# Patient Record
Sex: Male | Born: 2000 | Race: White | Hispanic: No | Marital: Single | Smoking: Never smoker
Health system: Southern US, Community
[De-identification: ages and names within clinical notes are randomized; demographics above are authoritative.]

## PROBLEM LIST (undated history)

## (undated) DIAGNOSIS — F419 Anxiety disorder, unspecified: Secondary | ICD-10-CM

## (undated) DIAGNOSIS — F909 Attention-deficit hyperactivity disorder, unspecified type: Secondary | ICD-10-CM

## (undated) DIAGNOSIS — F429 Obsessive-compulsive disorder, unspecified: Secondary | ICD-10-CM

## (undated) DIAGNOSIS — G4730 Sleep apnea, unspecified: Secondary | ICD-10-CM

## (undated) HISTORY — PX: TYMPANOSTOMY: SHX2586

## (undated) HISTORY — DX: Anxiety disorder, unspecified: F41.9

## (undated) HISTORY — PX: TONSILLECTOMY AND ADENOIDECTOMY: SHX28

## (undated) HISTORY — DX: Attention-deficit hyperactivity disorder, unspecified type: F90.9

## (undated) HISTORY — DX: Obsessive-compulsive disorder, unspecified: F42.9

## (undated) HISTORY — DX: Sleep apnea, unspecified: G47.30

---
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
## 2021-02-19 ENCOUNTER — Other Ambulatory Visit: Payer: Self-pay

## 2021-02-19 ENCOUNTER — Ambulatory Visit (INDEPENDENT_AMBULATORY_CARE_PROVIDER_SITE_OTHER): Payer: BC Managed Care – PPO

## 2021-02-19 ENCOUNTER — Ambulatory Visit (INDEPENDENT_AMBULATORY_CARE_PROVIDER_SITE_OTHER): Payer: BC Managed Care – PPO | Admitting: Sports Medicine

## 2021-02-19 DIAGNOSIS — M25362 Other instability, left knee: Secondary | ICD-10-CM | POA: Diagnosis not present

## 2021-02-19 DIAGNOSIS — M25561 Pain in right knee: Secondary | ICD-10-CM | POA: Diagnosis not present

## 2021-02-19 DIAGNOSIS — M25562 Pain in left knee: Secondary | ICD-10-CM

## 2021-02-19 MED ORDER — MELOXICAM 15 MG PO TABS: ORAL_TABLET | ORAL | 3 refills | Status: DC

## 2021-02-19 NOTE — Progress Notes (Signed)
    Procedures performed today:    None.  Independent interpretation of notes and tests performed by another provider:   None.  Brief History, Exam, Impression, and Recommendations:    Patellar instability of left knee This is a pleasant 20 year old male, has a long history of left anterior knee pain, as well as a sensation of instability in his patella. Worsening pain up and down stairs, getting up from the floor. On exam he has patellar subluxation both medially and laterally with reproduction of pain, he does have some tenderness at the patellar facets. Other ligamentous structures are intact, we discussed the pathophysiology, anatomy, we will do meloxicam, x-rays, formal physical therapy, he will also get a patellar stabilizing orthosis today, return to see me in 6 weeks, MRI if no better.    ___________________________________________ Ihor Austin. Benjamin Stain, M.D., ABFM., CAQSM. Primary Care and Sports Medicine [facility]  Adjunct Instructor of Family Medicine  [employer]

## 2021-02-19 NOTE — Assessment & Plan Note (Signed)
This is a pleasant 20 year old male, has a long history of left anterior knee pain, as well as a sensation of instability in his patella. Worsening pain up and down stairs, getting up from the floor. On exam he has patellar subluxation both medially and laterally with reproduction of pain, he does have some tenderness at the patellar facets. Other ligamentous structures are intact, we discussed the pathophysiology, anatomy, we will do meloxicam, x-rays, formal physical therapy, he will also get a patellar stabilizing orthosis today, return to see me in 6 weeks, MRI if no better.

## 2021-02-22 ENCOUNTER — Ambulatory Visit (INDEPENDENT_AMBULATORY_CARE_PROVIDER_SITE_OTHER): Payer: BC Managed Care – PPO | Admitting: Rehabilitative and Restorative Service Providers"

## 2021-02-22 ENCOUNTER — Other Ambulatory Visit: Payer: Self-pay

## 2021-02-22 DIAGNOSIS — M25562 Pain in left knee: Secondary | ICD-10-CM

## 2021-02-22 DIAGNOSIS — M25561 Pain in right knee: Secondary | ICD-10-CM

## 2021-02-22 DIAGNOSIS — M6281 Muscle weakness (generalized): Secondary | ICD-10-CM

## 2021-02-22 NOTE — Addendum Note (Signed)
Addended by: Margretta Ditty M on: 02/22/2021 01:53 PM   Modules accepted: Orders

## 2021-02-22 NOTE — Therapy (Signed)
[facility] [place] [address] [place], [place], [address] Phone: [phone]   Fax:  [phone]  Physical Therapy Evaluation  Patient Details  Name: Mark Navarro MRN: [id number] Date of Birth: 2001/01/17 Referring Provider (PT): Rodney Langton, MD   Encounter Date: 02/22/2021   PT End of Session - 02/22/21 0858    Visit Number 1    Number of Visits 12    Date for PT Re-Evaluation 04/05/21    Authorization Type BCBS    PT Start Time 0808    PT Stop Time 0855    PT Time Calculation (min) 47 min    Activity Tolerance Patient tolerated treatment well;Patient limited by pain    Behavior During Therapy [facility] for tasks assessed/performed           Past Medical History:  Diagnosis Date  . ADHD (attention deficit hyperactivity disorder)   . Anxiety   . OCD (obsessive compulsive disorder)     Past Surgical History:  Procedure Laterality Date  . TONSILLECTOMY AND ADENOIDECTOMY    . TYMPANOSTOMY      There were no vitals filed for this visit.    Subjective Assessment - 02/22/21 0812    Subjective The patient reports his left knee began hurting 02/07/21.  He has a h/o of popping his knee frequently.  He notes pain in the medial aspect of his knee-- the brace and meloxicam help.  Pain is worse with standing at work, stairs, and squating.    Patient Stated Goals pain free; increased strength in knees-- ability to lift, hike, without pain    Currently in Pain? Yes    Pain Score 0-No pain   none at rest; up to 8/10 at it's worst   Pain Location Knee    Pain Orientation Left    Pain Descriptors / Indicators Aching;Discomfort;Sore    Pain Type Acute pain    Pain Onset 1 to 4 weeks ago    Pain Frequency Intermittent    Aggravating Factors  worse at night; occasional sharp pains    Pain Relieving Factors rest, avoiding weight bearing              [facility] PT Assessment - 02/22/21 0819      Assessment   Medical  Diagnosis patellar instability of the L knee    Referring Provider (PT) Rodney Langton, MD    Onset Date/Surgical Date 02/07/21    Prior Therapy none      Precautions   Precautions Other (comment)    Precaution Comments brace on L knee for 6 weeks    Required Braces or Orthoses --   knee stsbility brace     Restrictions   Weight Bearing Restrictions No      Balance Screen   Has the patient fallen in the past 6 months No    Has the patient had a decrease in activity level because of a fear of falling?  No    Is the patient reluctant to leave their home because of a fear of falling?  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[employer]; stands x 6 hours    Leisure hikes some; [employer]      Observation/Other Assessments   Focus on Therapeutic Outcomes (FOTO)  40% functional status score      Sensation   Light Touch Appears Intact      ROM / Strength   AROM / PROM / Strength AROM;Strength      AROM   Overall AROM  Deficits    AROM Assessment Site Knee    Right/Left Knee Right;Left    Right Knee Extension 0    Right Knee Flexion 120    Left Knee Extension -4    Left Knee Flexion 116   pressure in medial patellar region     Strength   Overall Strength Deficits    Strength Assessment Site Hip;Knee;Ankle    Right/Left Hip Right;Left    Right Hip Flexion 5/5    Right Hip Extension 4+/5    Right Hip ABduction 4/5    Left Hip Flexion 4/5    Left Hip Extension 4/5    Left Hip ABduction 4-/5    Right/Left Knee Right;Left    Right Knee Flexion 5/5    Right Knee Extension 5/5    Left Knee Flexion 4/5    Left Knee  Extension 4/5    Right/Left Ankle Right;Left    Right Ankle Dorsiflexion 5/5    Right Ankle Plantar Flexion 4/5    Left Ankle Dorsiflexion 5/5    Left Ankle Plantar Flexion 5/5    Left Ankle Inversion 4/5      Flexibility   Soft Tissue Assessment /Muscle Length yes    Hamstrings -38 L and -30 R (beginning at 90/90 supine)      Palpation   Patella mobility hypermobile medial patellar mobility      Special Tests    Special Tests Knee Special Tests    Other special tests drawer tests negative; wall slides= R knee pain today with pain shifted to the right side to offset the L side    Knee Special tests  Step-up/Step Down Test      Patellofemoral Apprehension Test    Findings --      Step-up/Step Down    Findings Positive                      Objective measurements completed on examination: See above findings.       [facility] Adult PT Treatment/Exercise - 02/22/21 1350      Exercises   Exercises Knee/Hip      Knee/Hip Exercises: Stretches   Active Hamstring Stretch Right;Left;1 rep;30 seconds      Knee/Hip Exercises: Standing   Heel Raises Both;20 reps    SLS x 15 seconds with intermittent UE support      Knee/Hip Exercises: Supine   Bridges Strengthening;Both;10 reps    Straight Leg Raises Strengthening;Right;Left;10 reps      Knee/Hip Exercises: Sidelying   Hip ABduction Strengthening;Right;Left;10 reps                  PT Education - 02/22/21 0851    Education Details HEP    Person(s) Educated Patient    Methods Explanation;Demonstration;Handout    Comprehension Verbalized understanding;Returned demonstration               PT Long Term Goals - 02/22/21 0858      PT LONG TERM GOAL #1   Title The  patient will be indep with HEP.    Time 6    Period Weeks    Target Date 04/05/21      PT LONG TERM GOAL #2   Title The patient will improve functional status score from 40% up to 75%.    Time 6    Period Weeks    Target Date 04/05/21       PT LONG TERM GOAL #3   Title The patient will tolerate left single limb stance x 15 seconds without UE support.    Baseline tolerates 3 seconds.    Time 6    Period Weeks    Target Date 04/05/21      PT LONG TERM GOAL #4   Title The patient will demonstrate squat to reach the floor and return to standing.    Baseline *unable to tolerate wall slides with bilateral knee pain    Time 6    Period Weeks    Target Date 04/05/21      PT LONG TERM GOAL #5   Title The patient will improve HS length to -20 degrees from extension (supine 90/90 starting position).    Time 6    Period Weeks    Target Date 04/05/21                  Plan - 02/22/21 0901    Clinical Impression Statement The patient is an 20 year old male presenting to OP physical therapy with acute L knee pain.  He presents with impairments in LE strength, L knee AROM, patellar hypermobility, and pain limiting activities.  He has difficulty with functional tasks of step ups, squatting, or maintaining single leg stance.  PT to address deficits to optimize functional status.    Examination-Activity Limitations Locomotion Level;Squat;Stairs;Stand;Lift;Sit    Examination-Participation Restrictions Occupation;Community Activity    Stability/Clinical Decision Making Stable/Uncomplicated    Clinical Decision Making Low    Rehab Potential Good    PT Frequency 2x / week    PT Duration 6 weeks    PT Treatment/Interventions Patient/family education;Taping;Dry needling;Manual techniques;Therapeutic exercise;Therapeutic activities;Orthotic Fit/Training;ADLs/Self Care Home Management;Gait training;Stair training;Functional mobility training;Cryotherapy;Moist Heat;Iontophoresis 4mg /ml Dexamethasone    PT Next Visit Plan Knee stabilization activities; work on core/hip/knee/ankle stability; progress home walking to tolerance;    PT Home Exercise Plan [email or web address]    Consulted and Agree with Plan of Care Patient           Patient  will benefit from skilled therapeutic intervention in order to improve the following deficits and impairments:  Pain,Hypermobility,Postural dysfunction,Decreased range of motion,Decreased strength,Decreased activity tolerance,Increased fascial restricitons,Impaired flexibility  Visit Diagnosis: Acute pain of left knee  Acute pain of right knee  Muscle weakness (generalized)     Problem List Patient Active Problem List   Diagnosis Date Noted  . Patellar instability of left knee 02/19/2021  . Closed torus fracture of lower end of left radius 09/02/2017  . OCD (obsessive compulsive disorder) 05/25/2014  . GAD (generalized anxiety disorder) 05/25/2014    Pasqualina Colasurdo, PT 02/22/2021, 1:52 PM  [facility] [address] [place], [place], [address] Phone: [phone]   Fax:  [phone]  Name: Kaiser Gundlach MRN: [id number] Date of Birth: 2001/03/13

## 2021-02-22 NOTE — Patient Instructions (Signed)
Access Code: [email or web address] URL: [email or web address] Date: 02/22/2021 Prepared by: Margretta Ditty  Exercises Supine Hamstring Stretch with Strap - 2 x daily - 7 x weekly - 1 sets - 3 reps - 30 seconds hold Straight Leg Raise - 2 x daily - 7 x weekly - 1 sets - 10-15 reps Supine Bridge - 2 x daily - 7 x weekly - 1 sets - 10-15 reps Sidelying Hip Abduction - 2 x daily - 7 x weekly - 1 sets - 10-15 reps Single Leg Stance - 2 x daily - 7 x weekly - 1 sets - 3 reps - 15 seconds hold Standing Heel Raise - 2 x daily - 7 x weekly - 1 sets - 20 reps

## 2021-02-26 ENCOUNTER — Ambulatory Visit (INDEPENDENT_AMBULATORY_CARE_PROVIDER_SITE_OTHER): Payer: BC Managed Care – PPO | Admitting: Physical Therapy

## 2021-02-26 ENCOUNTER — Other Ambulatory Visit: Payer: Self-pay

## 2021-02-26 ENCOUNTER — Encounter: Payer: Self-pay | Admitting: Physical Therapy

## 2021-02-26 DIAGNOSIS — M25561 Pain in right knee: Secondary | ICD-10-CM

## 2021-02-26 DIAGNOSIS — M6281 Muscle weakness (generalized): Secondary | ICD-10-CM

## 2021-02-26 DIAGNOSIS — M25562 Pain in left knee: Secondary | ICD-10-CM

## 2021-02-26 NOTE — Patient Instructions (Signed)
Access Code: [email or web address] URL: [email or web address] Date: 02/26/2021 Prepared by: [facility]  Exercises Straight Leg Raise - 2 x daily - 7 x weekly - 1 sets - 10-15 reps Supine Bridge - 2 x daily - 7 x weekly - 1 sets - 10-15 reps - 5 hold Sidelying Hip Abduction - 2 x daily - 7 x weekly - 1 sets - 10-15 reps Single Leg Stance - 2 x daily - 7 x weekly - 1 sets - 3 reps - 15 seconds hold Standing Heel Raise - 2 x daily - 7 x weekly - 1 sets - 20 reps Gastroc Stretch with Foot at Wall - 1 x daily - 7 x weekly - 1 sets - 2 reps - 20 seconds hold Hooklying Hamstring Stretch with Strap - 1 x daily - 7 x weekly - 1 sets - 2 reps - 20 seconds hold Supine ITB Stretch with Strap - 2 x daily - 7 x weekly - 1 sets - 2 reps - 20 seconds hold Seated Hip Flexor Stretch - 1 x daily - 7 x weekly - 1 sets - 2 reps - 20 seconds hold

## 2021-02-26 NOTE — Therapy (Signed)
[facility] [place] [address] [place] [address] [place], [place], [address] Phone: [phone]   Fax:  [phone]  Physical Therapy Treatment  Patient Details  Name: Mark Navarro MRN: [id number] Date of Birth: [birth year] Referring Provider (PT): Rodney Langton, MD   Encounter Date: 02/26/2021   PT End of Session - 02/26/21 0938    Visit Number 2    Number of Visits 12    Date for PT Re-Evaluation 04/05/21    Authorization Type BCBS    PT Start Time [phone]    PT Stop Time 1017    PT Time Calculation (min) 43 min    Activity Tolerance Patient tolerated treatment well    Behavior During Therapy [facility] for tasks assessed/performed           Past Medical History:  Diagnosis Date  . ADHD (attention deficit hyperactivity disorder)   . Anxiety   . OCD (obsessive compulsive disorder)     Past Surgical History:  Procedure Laterality Date  . TONSILLECTOMY AND ADENOIDECTOMY    . TYMPANOSTOMY      There were no vitals filed for this visit.   Subjective Assessment - 02/26/21 0938    Subjective Pt reports that the exercises have been helping his Lt knee.  He continues to have pain with prolonged standing (at work).    Patient Stated Goals pain free; increased strength in knees-- ability to lift, hike, without pain    Currently in Pain? No/denies    Pain Score 0-No pain    Pain Location Knee    Pain Orientation Left    Pain Onset 1 to 4 weeks ago              [facility] PT Assessment - 02/26/21 0001      Assessment   Medical Diagnosis patellar instability of the L knee    Referring Provider (PT) Rodney Langton, MD    Onset Date/Surgical Date 02/07/21    Prior Therapy none            [facility] Adult PT Treatment/Exercise - 02/26/21 0001      Knee/Hip Exercises: Stretches   Passive Hamstring Stretch Right;Left;2 reps;20 seconds    Hip Flexor Stretch Right;Left;1 rep;20 seconds    ITB Stretch Left;Right;2 reps;20 seconds     Gastroc Stretch Left;Right;2 reps;20 seconds    Other Knee/Hip Stretches Lt/Rt adductor stretch in supine with strap x 20 sec each leg      Knee/Hip Exercises: Aerobic   Stationary Bike L1-3: 4 min      Knee/Hip Exercises: Standing   SLS SLS x 2 reps each leg x 20 sec; repeated with horiz head turns      Knee/Hip Exercises: Supine   Bridges Strengthening;1 set;10 reps   5 sec hold in ext   Straight Leg Raises Left;2 sets;Right;1 set;10 reps   3 sec hold in ext   Straight Leg Raises Limitations LLE fatigues quicker than Rt.      Knee/Hip Exercises: Sidelying   Hip ABduction Left;Right;Strengthening;1 set;10 reps    Hip ABduction Limitations cues for alignment                  PT Education - 02/26/21 1018    Education Details updated HEP    Person(s) Educated Patient    Methods Explanation;Demonstration;Verbal cues;Handout    Comprehension Verbalized understanding;Returned demonstration               PT Long Term Goals - 02/22/21 3009  PT LONG TERM GOAL #1   Title The patient will be indep with HEP.    Time 6    Period Weeks    Target Date 04/05/21      PT LONG TERM GOAL #2   Title The patient will improve functional status score from 40% up to 75%.    Time 6    Period Weeks    Target Date 04/05/21      PT LONG TERM GOAL #3   Title The patient will tolerate left single limb stance x 15 seconds without UE support.    Baseline tolerates 3 seconds.    Time 6    Period Weeks    Target Date 04/05/21      PT LONG TERM GOAL #4   Title The patient will demonstrate squat to reach the floor and return to standing.    Baseline *unable to tolerate wall slides with bilateral knee pain    Time 6    Period Weeks    Target Date 04/05/21      PT LONG TERM GOAL #5   Title The patient will improve HS length to -20 degrees from extension (supine 90/90 starting position).    Time 6    Period Weeks    Target Date 04/05/21                 Plan - 02/26/21  1018    Clinical Impression Statement Pt continues with difficulty and discomfort with Lt hamstring stretch in hooklying (pain in medial Lt knee).  Able to progress SLS with horiz head turns.  LLE fatigues quicker than RLE during mat level exercises. Goals are ongoing.    Examination-Activity Limitations Locomotion Level;Squat;Stairs;Stand;Lift;Sit    Examination-Participation Restrictions Occupation;Community Activity    Stability/Clinical Decision Making Stable/Uncomplicated    Rehab Potential Good    PT Frequency 2x / week    PT Duration 6 weeks    PT Treatment/Interventions Patient/family education;Taping;Dry needling;Manual techniques;Therapeutic exercise;Therapeutic activities;Orthotic Fit/Training;ADLs/Self Care Home Management;Gait training;Stair training;Functional mobility training;Cryotherapy;Moist Heat;Iontophoresis 4mg /ml Dexamethasone    PT Next Visit Plan Knee stabilization activities; work on core/hip/knee/ankle stability; progress home walking to tolerance;    PT Home Exercise Plan    Consulted and Agree with Plan of Care Patient           Patient will benefit from skilled therapeutic intervention in order to improve the following deficits and impairments:  Pain,Hypermobility,Postural dysfunction,Decreased range of motion,Decreased strength,Decreased activity tolerance,Increased fascial restricitons,Impaired flexibility  Visit Diagnosis: Acute pain of left knee  Muscle weakness (generalized)  Acute pain of right knee     Problem List Patient Active Problem List   Diagnosis Date Noted  . Patellar instability of left knee 02/19/2021  . Closed torus fracture of lower end of left radius 09/02/2017  . OCD (obsessive compulsive disorder) 05/25/2014  . GAD (generalized anxiety disorder) 05/25/2014    05/27/2014, PTA 02/26/21 12:08 PM  [facility] [place] [address] [address] [place], [place],  [place] Phone: [phone]   Fax:  [phone]  Name: Mark Navarro MRN: [name] Date of Birth: 2001/07/10

## 2021-02-28 ENCOUNTER — Other Ambulatory Visit: Payer: Self-pay

## 2021-02-28 ENCOUNTER — Ambulatory Visit (INDEPENDENT_AMBULATORY_CARE_PROVIDER_SITE_OTHER): Payer: BC Managed Care – PPO | Admitting: Rehabilitative and Restorative Service Providers"

## 2021-02-28 DIAGNOSIS — M6281 Muscle weakness (generalized): Secondary | ICD-10-CM

## 2021-02-28 DIAGNOSIS — M25561 Pain in right knee: Secondary | ICD-10-CM | POA: Diagnosis not present

## 2021-02-28 DIAGNOSIS — M25562 Pain in left knee: Secondary | ICD-10-CM | POA: Diagnosis not present

## 2021-02-28 NOTE — Therapy (Signed)
[facility] [place] [address] [place] [address] [place], [place], [address] Phone: [phone]   Fax:  [phone]  Physical Therapy Treatment  Patient Details  Name: Mark Navarro MRN: [id number] Date of Birth: Jul 31, 2001 Referring Provider (PT): Rodney Langton, MD   Encounter Date: 02/28/2021   PT End of Session - 02/28/21 0935    Visit Number 3    Number of Visits 12    Date for PT Re-Evaluation 04/05/21    Authorization Type BCBS    PT Start Time 0935    PT Stop Time 1015    PT Time Calculation (min) 40 min    Activity Tolerance Patient tolerated treatment well    Behavior During Therapy [facility] for tasks assessed/performed           Past Medical History:  Diagnosis Date  . ADHD (attention deficit hyperactivity disorder)   . Anxiety   . OCD (obsessive compulsive disorder)     Past Surgical History:  Procedure Laterality Date  . TONSILLECTOMY AND ADENOIDECTOMY    . TYMPANOSTOMY      There were no vitals filed for this visit.   Subjective Assessment - 02/28/21 1139    Subjective The patient feels fatigue in LEs, but no longer has knee pain.    Patient Stated Goals pain free; increased strength in knees-- ability to lift, hike, without pain    Currently in Pain? No/denies              [facility] PT Assessment - 02/28/21 7510      Assessment   Medical Diagnosis patellar instability of the L knee    Referring Provider (PT) Rodney Langton, MD    Onset Date/Surgical Date 02/07/21                         [facility] Adult PT Treatment/Exercise - 02/28/21 2585      Exercises   Exercises Knee/Hip;Other Exercises    Other Exercises  R leg longer than the left per observation on nu-step, however hips level in standing      Knee/Hip Exercises: Stretches   Active Hamstring Stretch Left;5 reps;Right    Gastroc Stretch Left;Right;2 reps;20 seconds      Knee/Hip Exercises: Aerobic   Nustep L5 x 3 minutes       Knee/Hip Exercises: Standing   Heel Raises Right;Left;10 reps    Hip Flexion Stengthening;Right;Left;10 reps    Hip Flexion Limitations marching on trampoline;  marching in place with 10 lb kettle bell overhead    Forward Lunges 10 reps    Forward Lunges Limitations isometric lunge with ipsilateral trunk rotation x 10 reps for isometric holds    Step Down Right;Left    Step Down Limitations lateral step downs from 4" step    SLS SLS working on vectors reaching anteriorly; single limb stance with head motion    SLS with Vectors the diver x 5 reps    Other Standing Knee Exercises SLS on trampoline near support    Other Standing Knee Exercises BOSU standing with mini squats x 5 reps      Knee/Hip Exercises: Supine   Bridges Strengthening;10 reps    Straight Leg Raises Strengthening;Left;15 reps    Straight Leg Raises Limitations L LE feels tight in gastroc with SLR                  PT Education - 02/28/21 1014    Education Details HEP  Person(s) Educated Patient    Methods Explanation;Demonstration;Handout    Comprehension Verbalized understanding;Returned demonstration               PT Long Term Goals - 02/22/21 0858      PT LONG TERM GOAL #1   Title The patient will be indep with HEP.    Time 6    Period Weeks    Target Date 04/05/21      PT LONG TERM GOAL #2   Title The patient will improve functional status score from 40% up to 75%.    Time 6    Period Weeks    Target Date 04/05/21      PT LONG TERM GOAL #3   Title The patient will tolerate left single limb stance x 15 seconds without UE support.    Baseline tolerates 3 seconds.    Time 6    Period Weeks    Target Date 04/05/21      PT LONG TERM GOAL #4   Title The patient will demonstrate squat to reach the floor and return to standing.    Baseline *unable to tolerate wall slides with bilateral knee pain    Time 6    Period Weeks    Target Date 04/05/21      PT LONG TERM GOAL #5   Title The  patient will improve HS length to -20 degrees from extension (supine 90/90 starting position).    Time 6    Period Weeks    Target Date 04/05/21                 Plan - 02/28/21 1015    Clinical Impression Statement The patient tolerates increasing difficulty with standing exercises.  PT modified gastroc stretch slightly for HEP and recommended adding walking frequently t/o the week for exercise.  Plan to continue to progress to patient tolerance.    PT Treatment/Interventions Patient/family education;Taping;Dry needling;Manual techniques;Therapeutic exercise;Therapeutic activities;Orthotic Fit/Training;ADLs/Self Care Home Management;Gait training;Stair training;Functional mobility training;Cryotherapy;Moist Heat;Iontophoresis 4mg /ml Dexamethasone    PT Next Visit Plan Knee stabilization activities; work on core/hip/knee/ankle stability; progress home walking to tolerance;    PT Home Exercise Plan [email or web address]    Consulted and Agree with Plan of Care Patient           Patient will benefit from skilled therapeutic intervention in order to improve the following deficits and impairments:     Visit Diagnosis: Acute pain of left knee  Muscle weakness (generalized)  Acute pain of right knee     Problem List Patient Active Problem List   Diagnosis Date Noted  . Patellar instability of left knee 02/19/2021  . Closed torus fracture of lower end of left radius 09/02/2017  . OCD (obsessive compulsive disorder) 05/25/2014  . GAD (generalized anxiety disorder) 05/25/2014    Jyair Kiraly, PT 02/28/2021, 11:41 AM  [facility] [address] [place] [place], [place], [address] Phone: [phone]   Fax:  [phone]  Name: Mark Navarro MRN: [id number] Date of Birth: 12-23-2000

## 2021-02-28 NOTE — Patient Instructions (Signed)
Access Code: [email or web address] URL: [email or web address] Date: 02/28/2021 Prepared by: Margretta Ditty  Exercises Supine ITB Stretch with Strap - 2 x daily - 7 x weekly - 1 sets - 2 reps - 20 seconds hold Hooklying Hamstring Stretch with Strap - 1 x daily - 7 x weekly - 1 sets - 2 reps - 20 seconds hold Straight Leg Raise - 2 x daily - 7 x weekly - 1 sets - 10-15 reps Supine Bridge - 2 x daily - 7 x weekly - 1 sets - 10-15 reps - 5 hold Sidelying Hip Abduction - 2 x daily - 7 x weekly - 1 sets - 10-15 reps Seated Hip Flexor Stretch - 1 x daily - 7 x weekly - 1 sets - 2 reps - 20 seconds hold Gastroc Stretch on Wall - 2 x daily - 7 x weekly - 1 sets - 10 reps Single Leg Stance - 2 x daily - 7 x weekly - 1 sets - 3 reps - 15 seconds hold Standing Heel Raise - 2 x daily - 7 x weekly - 1 sets - 20 reps

## 2021-03-05 ENCOUNTER — Encounter: Payer: BC Managed Care – PPO | Admitting: Physical Therapy

## 2021-03-07 ENCOUNTER — Encounter: Payer: Self-pay | Admitting: Physical Therapy

## 2021-03-07 ENCOUNTER — Ambulatory Visit (INDEPENDENT_AMBULATORY_CARE_PROVIDER_SITE_OTHER): Payer: BC Managed Care – PPO | Admitting: Physical Therapy

## 2021-03-07 ENCOUNTER — Other Ambulatory Visit: Payer: Self-pay

## 2021-03-07 DIAGNOSIS — M25562 Pain in left knee: Secondary | ICD-10-CM | POA: Diagnosis not present

## 2021-03-07 DIAGNOSIS — M25561 Pain in right knee: Secondary | ICD-10-CM | POA: Diagnosis not present

## 2021-03-07 DIAGNOSIS — M6281 Muscle weakness (generalized): Secondary | ICD-10-CM

## 2021-03-07 NOTE — Therapy (Signed)
[place], [place], [address] Phone: [phone]   Fax:  [phone]  Physical Therapy Treatment  Patient Details  Name: Mark Navarro MRN: [id number] Date of Birth: 2001/08/05 Referring Provider (PT): Aundria Mems, MD   Encounter Date: 03/07/2021   PT End of Session - 03/07/21 1020    Visit Number 4    Number of Visits 12    Date for PT Re-Evaluation 04/05/21    Authorization Type BCBS    PT Start Time 0935    PT Stop Time 1020    PT Time Calculation (min) 45 min    Activity Tolerance Patient tolerated treatment well    Behavior During Therapy [facility] for tasks assessed/performed           Past Medical History:  Diagnosis Date  . ADHD (attention deficit hyperactivity disorder)   . Anxiety   . OCD (obsessive compulsive disorder)     Past Surgical History:  Procedure Laterality Date  . TONSILLECTOMY AND ADENOIDECTOMY    . TYMPANOSTOMY      There were no vitals filed for this visit.   Subjective Assessment - 03/07/21 0940    Subjective Pt reports he is no longer taking pain medicine; last taken 1 wk ago.  He is noticing less pain at work with standing.  He is noticing more popping when straightening out his knees.    Patient Stated Goals pain free; increased strength in knees-- ability to lift, hike, without pain    Currently in Pain? No/denies    Pain Score 0-No pain              [facility] PT Assessment - 03/07/21 0001      Assessment   Medical Diagnosis patellar instability of the L knee    Referring Provider (PT) Aundria Mems, MD    Onset Date/Surgical Date 02/07/21      Flexibility   Hamstrings -26 L and -35 R (beginning at 90/90 supine)             [facility] Adult PT Treatment/Exercise - 03/07/21 0001      Knee/Hip Exercises: Stretches   Passive Hamstring Stretch Right;Left;20 seconds;3 reps   foot on chair standing, foot on 12" step. 3rd rep supine      Knee/Hip Exercises: Aerobic   Stationary Bike L1-3: 5 min      Knee/Hip Exercises: Standing   Lateral Step Up Right;Left    Lateral Step Up Limitations lateral heel taps on 3-4" step x 10 each. some pain in Lt knee with 4" step, no pain on 3".    Step Down 1 set;10 reps;Right;Step Height: 4";Hand Hold: 2   and retro step up LLE   SLS SLS x 15 sec each leg, 3 reps - 2nd one with horiz head turn, 3rd one on upside down bosu.    SLS with Vectors the diver x 5 reps, to touch chair with bilat hands    Other Standing Knee Exercises upside down BOSU standing with squats x 5 reps, intermittent UE to steady (challenging)      Manual Therapy   Manual Therapy Soft tissue mobilization    Soft tissue mobilization IASTM to Lt distal lateral quadricep to decrease fascial restrictions and improve mobility.                       PT Long Term Goals - 03/07/21 1005      PT LONG TERM GOAL #1  Title The patient will be indep with HEP.    Time 6    Period Weeks    Status On-going      PT LONG TERM GOAL #2   Title The patient will improve functional status score from 40% up to 75%.    Time 6    Period Weeks    Status On-going      PT LONG TERM GOAL #3   Title The patient will tolerate left single limb stance x 15 seconds without UE support.    Baseline tolerates 3 seconds.    Time 6    Period Weeks    Status Achieved      PT LONG TERM GOAL #4   Title The patient will demonstrate squat to reach the floor and return to standing.    Baseline --    Time 6    Period Weeks    Status Achieved      PT LONG TERM GOAL #5   Title The patient will improve HS length to -20 degrees from extension (supine 90/90 starting position).    Time 6    Period Weeks    Status On-going                 Plan - 03/07/21 1120    Clinical Impression Statement Lt hamstring length improving.  Pt has met LTG#3 and 4 with improved SLS and squat.  Decreased palpable tightness in Lt lateral/ant thigh  musculature after IASTM.  Pt reported some discomfort in Lt medial knee with lateral heel taps on 4", but none with 3" step.  Pt making good progress towards goals.    Rehab Potential Good    PT Frequency 2x / week    PT Duration 6 weeks    PT Treatment/Interventions Patient/family education;Taping;Dry needling;Manual techniques;Therapeutic exercise;Therapeutic activities;Orthotic Fit/Training;ADLs/Self Care Home Management;Gait training;Stair training;Functional mobility training;Cryotherapy;Moist Heat;Iontophoresis 4mg /ml Dexamethasone    PT Next Visit Plan Knee stabilization activities; work on core/hip/knee/ankle stability; progress home walking to tolerance;  Teach self IASTM.    PT Home Exercise Plan [email or web address]    Consulted and Agree with Plan of Care Patient           Patient will benefit from skilled therapeutic intervention in order to improve the following deficits and impairments:     Visit Diagnosis: Acute pain of left knee  Muscle weakness (generalized)  Acute pain of right knee     Problem List Patient Active Problem List   Diagnosis Date Noted  . Patellar instability of left knee 02/19/2021  . Closed torus fracture of lower end of left radius 09/02/2017  . OCD (obsessive compulsive disorder) 05/25/2014  . GAD (generalized anxiety disorder) 05/25/2014    Kerin Perna, PTA 03/07/21 11:23 AM  [place], [place], [address] Phone: [phone]   Fax:  [phone]  Name: Mark Navarro MRN: [id number] Date of Birth: 04-03-2001

## 2021-03-12 ENCOUNTER — Other Ambulatory Visit: Payer: Self-pay

## 2021-03-12 ENCOUNTER — Ambulatory Visit (INDEPENDENT_AMBULATORY_CARE_PROVIDER_SITE_OTHER): Payer: BC Managed Care – PPO | Admitting: Physical Therapy

## 2021-03-12 ENCOUNTER — Encounter: Payer: Self-pay | Admitting: Physical Therapy

## 2021-03-12 DIAGNOSIS — M25561 Pain in right knee: Secondary | ICD-10-CM

## 2021-03-12 DIAGNOSIS — M25562 Pain in left knee: Secondary | ICD-10-CM

## 2021-03-12 DIAGNOSIS — M6281 Muscle weakness (generalized): Secondary | ICD-10-CM | POA: Diagnosis not present

## 2021-03-12 NOTE — Therapy (Signed)
[facility] [place] [address] [place] [address] [place], [place], [address] Phone: [phone]   Fax:  [phone]  Physical Therapy Treatment  Patient Details  Name: Mark Navarro MRN: [id number] Date of Birth: [birth year] Referring Provider (PT): Rodney Langton, MD   Encounter Date: 03/12/2021   PT End of Session - 03/12/21 0944    Visit Number 5    Number of Visits 12    Date for PT Re-Evaluation 04/05/21    Authorization Type BCBS    PT Start Time 0935    PT Stop Time 1014    PT Time Calculation (min) 39 min    Activity Tolerance Patient tolerated treatment well    Behavior During Therapy [facility] for tasks assessed/performed           Past Medical History:  Diagnosis Date  . ADHD (attention deficit hyperactivity disorder)   . Anxiety   . OCD (obsessive compulsive disorder)     Past Surgical History:  Procedure Laterality Date  . TONSILLECTOMY AND ADENOIDECTOMY    . TYMPANOSTOMY      There were no vitals filed for this visit.   Subjective Assessment - 03/12/21 0940    Subjective Pt reports his Lt knee didn't hurt as much at end of shift last time he worked.  He is still noticing popping in knees when straightening knees.    Patient Stated Goals pain free; increased strength in knees-- ability to lift, hike, without pain    Currently in Pain? No/denies    Pain Score 0-No pain              [facility] PT Assessment - 03/12/21 0001      Assessment   Medical Diagnosis patellar instability of the L knee    Referring Provider (PT) Rodney Langton, MD    Onset Date/Surgical Date 02/07/21    Prior Therapy none            [facility] Adult PT Treatment/Exercise - 03/12/21 0001      Knee/Hip Exercises: Stretches   Passive Hamstring Stretch Right;Left;2 reps;30 seconds   foot on 12" step   Gastroc Stretch Right;Left; 2 rep;10 seconds      Knee/Hip Exercises: Aerobic   Stationary Bike L3: 5 min for warm up.      Knee/Hip  Exercises: Standing   Lateral Step Up Right;Left    Lateral Step Up Limitations lateral heel taps on 6" step x 10 each.    Forward Step Up Right;Left;1 set;10 reps   onto Bosu, intermittent UE to steady   Step Down Right;Left;1 set   and retro step up LLE   Other Standing Knee Exercises upside down BOSU standing with squats x 10 reps, intermittent UE to steady      Manual Therapy   Soft tissue mobilization IASTM/ STM to Lt quad and hamstring to decrease fascial restrictions and improve mobility.                       PT Long Term Goals - 03/07/21 1005      PT LONG TERM GOAL #1   Title The patient will be indep with HEP.    Time 6    Period Weeks    Status On-going      PT LONG TERM GOAL #2   Title The patient will improve functional status score from 40% up to 75%.    Time 6    Period Weeks    Status On-going  PT LONG TERM GOAL #3   Title The patient will tolerate left single limb stance x 15 seconds without UE support.    Baseline tolerates 3 seconds.    Time 6    Period Weeks    Status Achieved      PT LONG TERM GOAL #4   Title The patient will demonstrate squat to reach the floor and return to standing.    Baseline --    Time 6    Period Weeks    Status Achieved      PT LONG TERM GOAL #5   Title The patient will improve HS length to -20 degrees from extension (supine 90/90 starting position).    Time 6    Period Weeks    Status On-going                 Plan - 03/12/21 0950    Clinical Impression Statement No pain with lateral heel taps on 6" today. Tenderness and tightness in Lt lateral quad and hamstring; improved with IASTM/STM.  Pt able to complete stairs/ squat exercises with less difficulty today.  PRogressing well towards remaining goals.    Rehab Potential Good    PT Frequency 2x / week    PT Duration 6 weeks    PT Treatment/Interventions Patient/family education;Taping;Dry needling;Manual techniques;Therapeutic  exercise;Therapeutic activities;Orthotic Fit/Training;ADLs/Self Care Home Management;Gait training;Stair training;Functional mobility training;Cryotherapy;Moist Heat;Iontophoresis 4mg /ml Dexamethasone    PT Next Visit Plan Knee stabilization activities; work on core/hip/knee/ankle stability; progress home walking to tolerance;  Teach self IASTM.    PT Home Exercise Plan [email or web address]    Consulted and Agree with Plan of Care Patient           Patient will benefit from skilled therapeutic intervention in order to improve the following deficits and impairments:  Pain,Hypermobility,Postural dysfunction,Decreased range of motion,Decreased strength,Decreased activity tolerance,Increased fascial restricitons,Impaired flexibility  Visit Diagnosis: Acute pain of left knee  Muscle weakness (generalized)  Acute pain of right knee     Problem List Patient Active Problem List   Diagnosis Date Noted  . Patellar instability of left knee 02/19/2021  . Closed torus fracture of lower end of left radius 09/02/2017  . OCD (obsessive compulsive disorder) 05/25/2014  . GAD (generalized anxiety disorder) 05/25/2014   05/27/2014, PTA 03/12/21 2:55 PM  [facility] [place] [address] [place] [address] [place], [place], [place] Phone: [phone]   Fax:  [phone]  Name: Mark Navarro MRN: [name] Date of Birth: [birth year]

## 2021-03-14 ENCOUNTER — Encounter: Payer: BC Managed Care – PPO | Admitting: Physical Therapy

## 2021-03-19 ENCOUNTER — Encounter: Payer: Self-pay | Admitting: Rehabilitative and Restorative Service Providers"

## 2021-03-19 ENCOUNTER — Ambulatory Visit (INDEPENDENT_AMBULATORY_CARE_PROVIDER_SITE_OTHER): Payer: BC Managed Care – PPO | Admitting: Rehabilitative and Restorative Service Providers"

## 2021-03-19 ENCOUNTER — Other Ambulatory Visit: Payer: Self-pay

## 2021-03-19 DIAGNOSIS — M25561 Pain in right knee: Secondary | ICD-10-CM

## 2021-03-19 DIAGNOSIS — M6281 Muscle weakness (generalized): Secondary | ICD-10-CM | POA: Diagnosis not present

## 2021-03-19 DIAGNOSIS — M25562 Pain in left knee: Secondary | ICD-10-CM | POA: Diagnosis not present

## 2021-03-19 NOTE — Therapy (Signed)
[facility] [place] [address] [place], [place], [address] Phone: [phone]   Fax:  [phone]  Physical Therapy Treatment  Patient Details  Name: Mark Navarro MRN: [id number] Date of Birth: 15-Jun-2001 Referring Provider (PT): Rodney Langton, MD   Encounter Date: 03/19/2021   PT End of Session - 03/19/21 0809    Visit Number 6    Number of Visits 12    Date for PT Re-Evaluation 04/05/21    Authorization Type BCBS    PT Start Time 0804    PT Stop Time 0843    PT Time Calculation (min) 39 min    Activity Tolerance Patient tolerated treatment well    Behavior During Therapy [facility] for tasks assessed/performed           Past Medical History:  Diagnosis Date  . ADHD (attention deficit hyperactivity disorder)   . Anxiety   . OCD (obsessive compulsive disorder)     Past Surgical History:  Procedure Laterality Date  . TONSILLECTOMY AND ADENOIDECTOMY    . TYMPANOSTOMY      There were no vitals filed for this visit.   Subjective Assessment - 03/19/21 0806    Subjective Patient is not feeling any pain in the L knee.  He notes occasional popping when he stands up.    Patient Stated Goals pain free; increased strength in knees-- ability to lift, hike, without pain    Currently in Pain? No/denies              [facility] PT Assessment - 03/19/21 0813      Assessment   Medical Diagnosis patellar instability of the L knee    Referring Provider (PT) Rodney Langton, MD    Onset Date/Surgical Date 02/07/21      Flexibility   Soft Tissue Assessment /Muscle Length yes    Hamstrings -20 degrees bilaterally (beginning at 90/90 supine).                         [facility] Adult PT Treatment/Exercise - 03/19/21 0814      Exercises   Exercises Knee/Hip;Other Exercises      Knee/Hip Exercises: Stretches   Passive Hamstring Stretch Right;Left;1 rep;30 seconds    Gastroc Stretch Right;Left;1 rep;60 seconds     Other Knee/Hip Stretches slant board stretch x 30 seconds      Knee/Hip Exercises: Aerobic   Stationary Bike L6 x 4 minutes for warm up      Knee/Hip Exercises: Standing   Heel Raises Both;10 reps    Heel Raises Limitations single leg x 10 reps near support surface    Lateral Step Up Right;Left    Lateral Step Up Limitations lateral heel taps on 4" step x 10 each.    Wall Squat 10 reps;3 seconds    SLS SLS R and L    SLS with Vectors the diver x 5 reps R and L sides      Knee/Hip Exercises: Supine   Bridges Both;10 reps    Other Supine Knee/Hip Exercises bridges with marches x 10 reps      Manual Therapy   Manual Therapy Soft tissue mobilization    Soft tissue mobilization IASTM/ STM to Lt quad and hamstring to decrease fascial restrictions and improve mobility; bilateral IASTM                  PT Education - 03/19/21 1145    Education Details HEP progression  Person(s) Educated Patient    Methods Explanation;Demonstration;Handout    Comprehension Verbalized understanding;Returned demonstration               PT Long Term Goals - 03/07/21 1005      PT LONG TERM GOAL #1   Title The patient will be indep with HEP.    Time 6    Period Weeks    Status On-going      PT LONG TERM GOAL #2   Title The patient will improve functional status score from 40% up to 75%.    Time 6    Period Weeks    Status On-going      PT LONG TERM GOAL #3   Title The patient will tolerate left single limb stance x 15 seconds without UE support.    Baseline tolerates 3 seconds.    Time 6    Period Weeks    Status Achieved      PT LONG TERM GOAL #4   Title The patient will demonstrate squat to reach the floor and return to standing.    Baseline --    Time 6    Period Weeks    Status Achieved      PT LONG TERM GOAL #5   Title The patient will improve HS length to -20 degrees from extension (supine 90/90 starting position).    Time 6    Period Weeks    Status On-going                  Plan - 03/19/21 4098    Clinical Impression Statement The patient has no pain today with therapy.  PT progressed HEP and recommended walking regularly for general conditioning.  Plan to progress dec'ing frequency to 1x/week encouraging greater strengthening at home.    PT Treatment/Interventions Patient/family education;Taping;Dry needling;Manual techniques;Therapeutic exercise;Therapeutic activities;Orthotic Fit/Training;ADLs/Self Care Home Management;Gait training;Stair training;Functional mobility training;Cryotherapy;Moist Heat;Iontophoresis 4mg /ml Dexamethasone    PT Next Visit Plan Knee stabilization activities; work on core/hip/knee/ankle stability; progress home walking to tolerance;  Teach self IASTM.    PT Home Exercise Plan [email or web address]    Consulted and Agree with Plan of Care Patient           Patient will benefit from skilled therapeutic intervention in order to improve the following deficits and impairments:     Visit Diagnosis: Acute pain of left knee  Muscle weakness (generalized)  Acute pain of right knee     Problem List Patient Active Problem List   Diagnosis Date Noted  . Patellar instability of left knee 02/19/2021  . Closed torus fracture of lower end of left radius 09/02/2017  . OCD (obsessive compulsive disorder) 05/25/2014  . GAD (generalized anxiety disorder) 05/25/2014    Tekeya Geffert, PT 03/19/2021, 12:51 PM  [facility] [address] [place], [place], [address] Phone: [phone]   Fax:  [phone]  Name: Mark Navarro MRN: [id number] Date of Birth: 16-Jun-2001

## 2021-03-19 NOTE — Patient Instructions (Signed)
Access Code: [email or web address] URL: [email or web address] Date: 03/19/2021 Prepared by: Margretta Ditty  Exercises Supine ITB Stretch with Strap - 2 x daily - 7 x weekly - 1 sets - 2 reps - 20 seconds hold Hooklying Hamstring Stretch with Strap - 1 x daily - 7 x weekly - 1 sets - 2 reps - 20 seconds hold Marching Bridge - 2 x daily - 7 x weekly - 1 sets - 10 reps Seated Hip Flexor Stretch - 1 x daily - 7 x weekly - 1 sets - 2 reps - 20 seconds hold Gastroc Stretch on Wall - 2 x daily - 7 x weekly - 1 sets - 10 reps The Diver - 2 x daily - 7 x weekly - 1 sets - 5 reps Standing Single Leg Heel Raise - 2 x daily - 7 x weekly - 1 sets - 10 reps Wall Squat - 2 x daily - 7 x weekly - 1 sets - 10 reps

## 2021-03-20 ENCOUNTER — Other Ambulatory Visit: Payer: Self-pay

## 2021-03-20 ENCOUNTER — Emergency Department (INDEPENDENT_AMBULATORY_CARE_PROVIDER_SITE_OTHER)
Admission: EM | Admit: 2021-03-20 | Discharge: 2021-03-20 | Disposition: A | Discharge status: Home / Self Care | Payer: BC Managed Care – PPO | Source: Home / Self Care

## 2021-03-20 ENCOUNTER — Emergency Department (INDEPENDENT_AMBULATORY_CARE_PROVIDER_SITE_OTHER): Payer: BC Managed Care – PPO

## 2021-03-20 DIAGNOSIS — M542 Cervicalgia: Secondary | ICD-10-CM

## 2021-03-20 DIAGNOSIS — Z041 Encounter for examination and observation following transport accident: Secondary | ICD-10-CM | POA: Diagnosis not present

## 2021-03-20 DIAGNOSIS — M25512 Pain in left shoulder: Secondary | ICD-10-CM | POA: Diagnosis not present

## 2021-03-20 MED ORDER — ACETAMINOPHEN 325 MG PO TABS
650.0000 mg | ORAL_TABLET | Freq: Once | ORAL | Status: AC
  Administered 2021-03-20: 650 mg via ORAL

## 2021-03-20 MED ORDER — BACLOFEN 10 MG PO TABS: 10.0000 mg | ORAL_TABLET | Freq: Three times a day (TID) | ORAL | 0 refills | Status: DC

## 2021-03-20 MED ORDER — IBUPROFEN 800 MG PO TABS: 800.0000 mg | ORAL_TABLET | Freq: Three times a day (TID) | ORAL | 0 refills | Status: AC

## 2021-03-20 NOTE — ED Notes (Signed)
Pt c/o L ear, neck and shoulder pain. Denies cp. He is a/o x 4. BP is 133/74, HR 87. Returned to waiting area.

## 2021-03-20 NOTE — Discharge Instructions (Addendum)
Advised/instructed patient may use Ibuprofen 800 mg 2-3 times daily as needed for the next 7-10 days.  Instructed patient to hold Meloxicam 15 mg when taking ibuprofen.  Advised do not take these medications together one or the other.  Advised/instructed patient may use Baclofen 10 mg daily or as needed for the next 7-10 days.  Work note provided per patient's request.
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
## 2021-03-20 NOTE — ED Notes (Signed)
Pt brought back up to UC by radiology. Requested tylenol. Radiology reports pt c/o dizziness when he got back into room.

## 2021-03-27 ENCOUNTER — Ambulatory Visit (INDEPENDENT_AMBULATORY_CARE_PROVIDER_SITE_OTHER): Payer: BC Managed Care – PPO | Admitting: Physical Therapy

## 2021-03-27 ENCOUNTER — Encounter: Payer: Self-pay | Admitting: Physical Therapy

## 2021-03-27 ENCOUNTER — Other Ambulatory Visit: Payer: Self-pay

## 2021-03-27 DIAGNOSIS — M6281 Muscle weakness (generalized): Secondary | ICD-10-CM | POA: Diagnosis not present

## 2021-03-27 DIAGNOSIS — M25562 Pain in left knee: Secondary | ICD-10-CM

## 2021-03-27 DIAGNOSIS — M25561 Pain in right knee: Secondary | ICD-10-CM | POA: Diagnosis not present
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
## 2021-04-02 ENCOUNTER — Other Ambulatory Visit: Payer: Self-pay

## 2021-04-02 ENCOUNTER — Ambulatory Visit (INDEPENDENT_AMBULATORY_CARE_PROVIDER_SITE_OTHER): Payer: BC Managed Care – PPO | Admitting: Sports Medicine

## 2021-04-02 DIAGNOSIS — M25362 Other instability, left knee: Secondary | ICD-10-CM | POA: Diagnosis not present

## 2021-04-02 NOTE — Assessment & Plan Note (Addendum)
This is a pleasant 20 year old male, treated him for patellar instability of the left knee with patellofemoral type symptoms. X-rays were negative, we placed him through aggressive formal physical therapy and he returns today pain-free, return as needed. He will continue his conditioning exercises indefinitely.

## 2021-04-02 NOTE — Progress Notes (Signed)
    Procedures performed today:    None.  Independent interpretation of notes and tests performed by another provider:   None.  Brief History, Exam, Impression, and Recommendations:    Patellar instability of left knee This is a pleasant 20 year old male, treated him for patellar instability of the left knee with patellofemoral type symptoms. X-rays were negative, we placed him through aggressive formal physical therapy and he returns today pain-free, return as needed. He will continue his conditioning exercises indefinitely.    ___________________________________________ Ihor Austin. Benjamin Stain, M.D., ABFM., CAQSM. Primary Care and Sports Medicine [facility]  Adjunct Instructor of Family Medicine  [employer]

## 2021-04-03 ENCOUNTER — Encounter: Payer: BC Managed Care – PPO | Admitting: Physical Therapy

## 2021-06-27 DIAGNOSIS — Z20822 Contact with and (suspected) exposure to covid-19: Secondary | ICD-10-CM | POA: Diagnosis not present

## 2021-07-13 ENCOUNTER — Emergency Department (INDEPENDENT_AMBULATORY_CARE_PROVIDER_SITE_OTHER)
Admission: EM | Admit: 2021-07-13 | Discharge: 2021-07-13 | Disposition: A | Discharge status: Home / Self Care | Payer: BC Managed Care – PPO | Source: Home / Self Care | Attending: Family Medicine | Admitting: Family Medicine

## 2021-07-13 ENCOUNTER — Encounter: Payer: Self-pay | Admitting: Emergency Medicine

## 2021-07-13 ENCOUNTER — Other Ambulatory Visit: Payer: Self-pay

## 2021-07-13 DIAGNOSIS — J069 Acute upper respiratory infection, unspecified: Secondary | ICD-10-CM | POA: Diagnosis not present

## 2021-07-13 DIAGNOSIS — H6592 Unspecified nonsuppurative otitis media, left ear: Secondary | ICD-10-CM

## 2021-07-13 DIAGNOSIS — H6122 Impacted cerumen, left ear: Secondary | ICD-10-CM

## 2021-07-13 MED ORDER — AMOXICILLIN 875 MG PO TABS: 875.0000 mg | ORAL_TABLET | Freq: Two times a day (BID) | ORAL | 0 refills | Status: DC

## 2021-07-13 MED ORDER — PREDNISONE 20 MG PO TABS: ORAL_TABLET | ORAL | 0 refills | Status: DC

## 2021-07-13 NOTE — Discharge Instructions (Signed)
Take plain guaifenesin (1200mg  extended release tabs such as Mucinex) twice daily, with plenty of water, for cough and congestion.  May add Pseudoephedrine (30mg , one or two every 4 to 6 hours) for sinus congestion.  Get adequate rest.   May use Afrin nasal spray (or generic oxymetazoline) each morning for about 5 days and then discontinue.  Also recommend using saline nasal spray several times daily and saline nasal irrigation (AYR is a common brand).  Use Flonase nasal spray each morning after using Afrin nasal spray and saline nasal irrigation. Try warm salt water gargles for sore throat.  Stop all antihistamines for now, and other non-prescription cough/cold preparations. May take Tylenol if needed for pain. May take Delsym Cough Suppressant ("12 Hour Cough Relief") at bedtime for nighttime cough.   If your COVID-19 test is positive, isolate yourself for five days from today.  At the end of five days you may end isolation if your symptoms have cleared or improved, and you have not had a fever for 24 hours. At this time you should wear a mask for five more days when you are around others.   If symptoms become significantly worse during the night or over the weekend, proceed to the local emergency room.

## 2021-07-13 NOTE — ED Triage Notes (Signed)
Patient c/o productive cough, left ear pain, nasal and chest congestion for 3 days.  Patient denies any OTC meds.  Patient is not vaccinated for COVID.

## 2021-07-13 NOTE — ED Provider Notes (Signed)
[facility]    CSN: [id number] Arrival date & time: 07/13/21  9826      History   Chief Complaint Chief Complaint  Patient presents with   Nasal Congestion    HPI Mark Navarro is a 20 y.o. male.   Five days ago patient noted that his left ear felt clogged.  Three days ago he developed sore throat, nasal congestion, and fatigue.  Yesterday he developed a productive cough.  He denies shortness of breath or pleuritic pain.  He has not been vaccinated for COVID19.  The history is provided by the patient.   Past Medical History:  Diagnosis Date   ADHD (attention deficit hyperactivity disorder)    Anxiety    OCD (obsessive compulsive disorder)     Patient Active Problem List   Diagnosis Date Noted   Patellar instability of left knee 02/19/2021   Closed torus fracture of lower end of left radius 09/02/2017   OCD (obsessive compulsive disorder) 05/25/2014   GAD (generalized anxiety disorder) 05/25/2014    Past Surgical History:  Procedure Laterality Date   TONSILLECTOMY AND ADENOIDECTOMY     TYMPANOSTOMY         Home Medications    Prior to Admission medications   Medication Sig Start Date End Date Taking? Authorizing Provider  amoxicillin (AMOXIL) 875 MG tablet Take 1 tablet (875 mg total) by mouth 2 (two) times daily. 07/13/21  Yes Lattie Haw, MD  FLUoxetine (PROZAC) 10 MG capsule  08/30/17  Yes [provider]  predniSONE (DELTASONE) 20 MG tablet Take one tab by mouth twice daily for 4 days, then one daily for 3 days. Take with food. 07/13/21  Yes Lattie Haw, MD  baclofen (LIORESAL) 10 MG tablet Take 1 tablet (10 mg total) by mouth 3 (three) times daily. 03/20/21   Trevor Iha, FNP  GuanFACINE HCl 3 MG TB24  08/30/17   [provider]  GuanFACINE HCl 3 MG TB24 Take by mouth. Patient not taking: No sig reported    [provider]  ibuprofen (ADVIL) 800 MG tablet Take 1 tablet (800 mg total) by mouth 3 (three) times  daily. 03/20/21   Trevor Iha, FNP  meloxicam (MOBIC) 15 MG tablet One tab PO qAM with a meal for 2 weeks, then daily prn pain. 02/19/21   Monica Becton, MD  triamcinolone (NASACORT) 55 MCG/ACT AERO nasal inhaler Place 2 sprays into the nose daily. Patient not taking: No sig reported    [provider]    Family History Family History  Problem Relation Age of Onset   Anxiety disorder Mother    Thyroid disease Mother    Bipolar disorder Maternal Aunt    Anxiety disorder Maternal Aunt    Drug abuse Maternal Aunt    Alcohol abuse Maternal Aunt    Anxiety disorder Maternal Grandmother    Bipolar disorder Maternal Grandmother    Alcohol abuse Maternal Grandmother    Drug abuse Maternal Grandmother    Hyperlipidemia Father     Social History Social History   Tobacco Use   Smoking status: Never   Smokeless tobacco: Never  Vaping Use   Vaping Use: Never used  Substance Use Topics   Alcohol use: No   Drug use: No     Allergies   Patient has no known allergies.   Review of Systems Review of Systems + sore throat + cough No pleuritic pain No wheezing + nasal congestion + post-nasal drainage No sinus pain/pressure  No itchy/red eyes + left earache No hemoptysis ? SOB No fever/chills No nausea No vomiting No abdominal pain No diarrhea No urinary symptoms No skin rash + fatigue No myalgias No headache   Physical Exam Triage Vital Signs ED Triage Vitals [07/13/21 0848]  Enc Vitals Group     BP 125/75     Pulse Rate 95     Resp      Temp 98.7 F (37.1 C)     Temp Source Oral     SpO2 97 %     Weight 210 lb (95.3 kg)     Height 5\' 10"  (1.778 m)     Head Circumference      Peak Flow      Pain Score 0     Pain Loc      Pain Edu?      Excl. in GC?    No data found.  Updated Vital Signs BP 125/75 (BP Location: Right Arm)   Pulse 95   Temp 98.7 F (37.1 C) (Oral)   Ht 5\' 10"  (1.778 m)   Wt 95.3 kg   SpO2 97%   BMI 30.13 kg/m    Visual Acuity Right Eye Distance:   Left Eye Distance:   Bilateral Distance:    Right Eye Near:   Left Eye Near:    Bilateral Near:     Physical Exam Nursing notes and Vital Signs reviewed. Appearance:  Patient appears stated age, and in no acute distress Eyes:  Pupils are equal, round, and reactive to light and accomodation.  Extraocular movement is intact.  Conjunctivae are not inflamed  Ears:   Right canal and tympanic membrane normal.  Left canal occluded with cerumen.  Post lavage, the left canal is normal but the left tympanic membrane is erythematous with serous effusion. Nose:  Congested left turbinates.  No sinus tenderness.    Pharynx:  Normal Neck:  Supple.  Mildly enlarged lateral nodes are present, tender to palpation on the left.   Lungs:  Clear to auscultation.  Breath sounds are equal.  Moving air well. Heart:  Regular rate and rhythm without murmurs, rubs, or gallops.  Abdomen:  Nontender without masses or hepatosplenomegaly.  Bowel sounds are present.  No CVA or flank tenderness.  Extremities:  No edema.  Skin:  No rash present.   UC Treatments / Results  Labs (all labs ordered are listed, but only abnormal results are displayed) Labs Reviewed  COVID-19, FLU A+B NAA    EKG   Radiology No results found.  Procedures Procedures (including critical care time)  Medications Ordered in UC Medications - No data to display  Initial Impression / Assessment and Plan / UC Course  I have reviewed the triage vital signs and the nursing notes.  Pertinent labs & imaging results that were available during my care of the patient were reviewed by me and considered in my medical decision making (see chart for details).    COVID19 PCR pending. Begin amoxicillin and prednisone burst/taper. Followup with Family Doctor if not improved in one week.   Final Clinical Impressions(s) / UC Diagnoses   Final diagnoses:  Impacted cerumen of left ear  Viral URI  Left  otitis media with effusion     Discharge Instructions      Take plain guaifenesin (1200mg  extended release tabs such as Mucinex) twice daily, with plenty of water, for cough and congestion.  May add Pseudoephedrine (30mg , one or two every 4 to 6 hours) for  sinus congestion.  Get adequate rest.   May use Afrin nasal spray (or generic oxymetazoline) each morning for about 5 days and then discontinue.  Also recommend using saline nasal spray several times daily and saline nasal irrigation (AYR is a common brand).  Use Flonase nasal spray each morning after using Afrin nasal spray and saline nasal irrigation. Try warm salt water gargles for sore throat.  Stop all antihistamines for now, and other non-prescription cough/cold preparations. May take Tylenol if needed for pain. May take Delsym Cough Suppressant ("12 Hour Cough Relief") at bedtime for nighttime cough.   If your COVID-19 test is positive, isolate yourself for five days from today.  At the end of five days you may end isolation if your symptoms have cleared or improved, and you have not had a fever for 24 hours. At this time you should wear a mask for five more days when you are around others.   If symptoms become significantly worse during the night or over the weekend, proceed to the local emergency room.          ED Prescriptions     Medication Sig Dispense Auth. Provider   amoxicillin (AMOXIL) 875 MG tablet Take 1 tablet (875 mg total) by mouth 2 (two) times daily. 20 tablet Lattie Haw, MD   predniSONE (DELTASONE) 20 MG tablet Take one tab by mouth twice daily for 4 days, then one daily for 3 days. Take with food. 11 tablet Lattie Haw, MD         Lattie Haw, MD 07/15/21 [phone]

## 2021-07-15 LAB — COVID-19, FLU A+B NAA
Influenza A, NAA: NOT DETECTED
Influenza B, NAA: NOT DETECTED
SARS-CoV-2, NAA: NOT DETECTED

## 2021-08-30 IMAGING — DX DG KNEE COMPLETE 4+V*L*
4 series · 4 of 4 positions shown · non-contrast
Comparison: None.

CLINICAL DATA: Left-sided knee and patellar pain.

EXAM:
LEFT KNEE - COMPLETE 4+ VIEW

[tunnel]
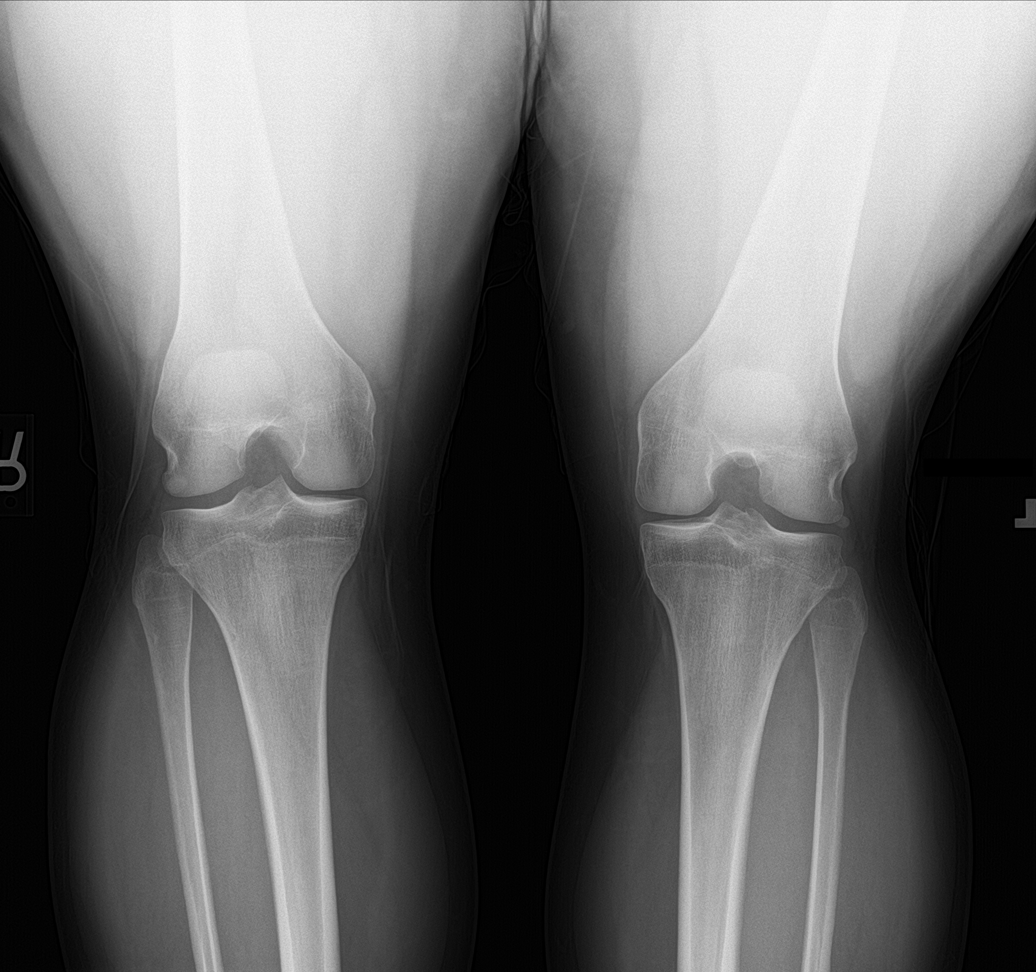

[knee lat]
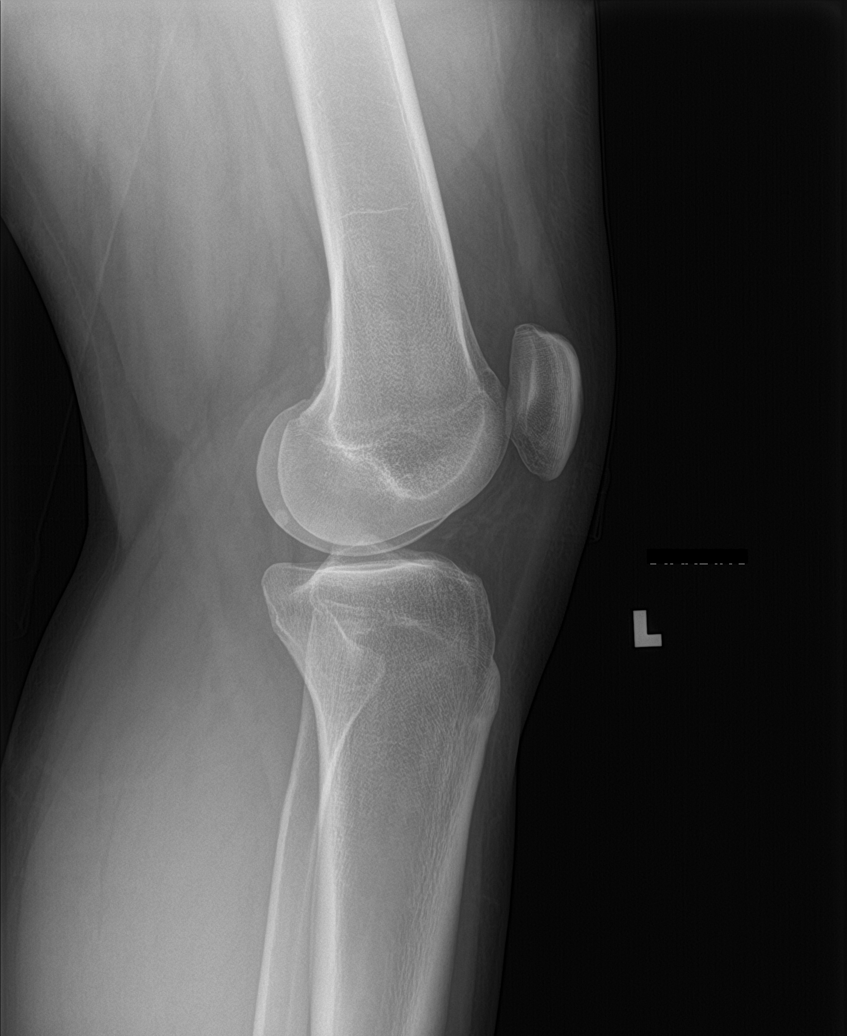

[knee sunrise]
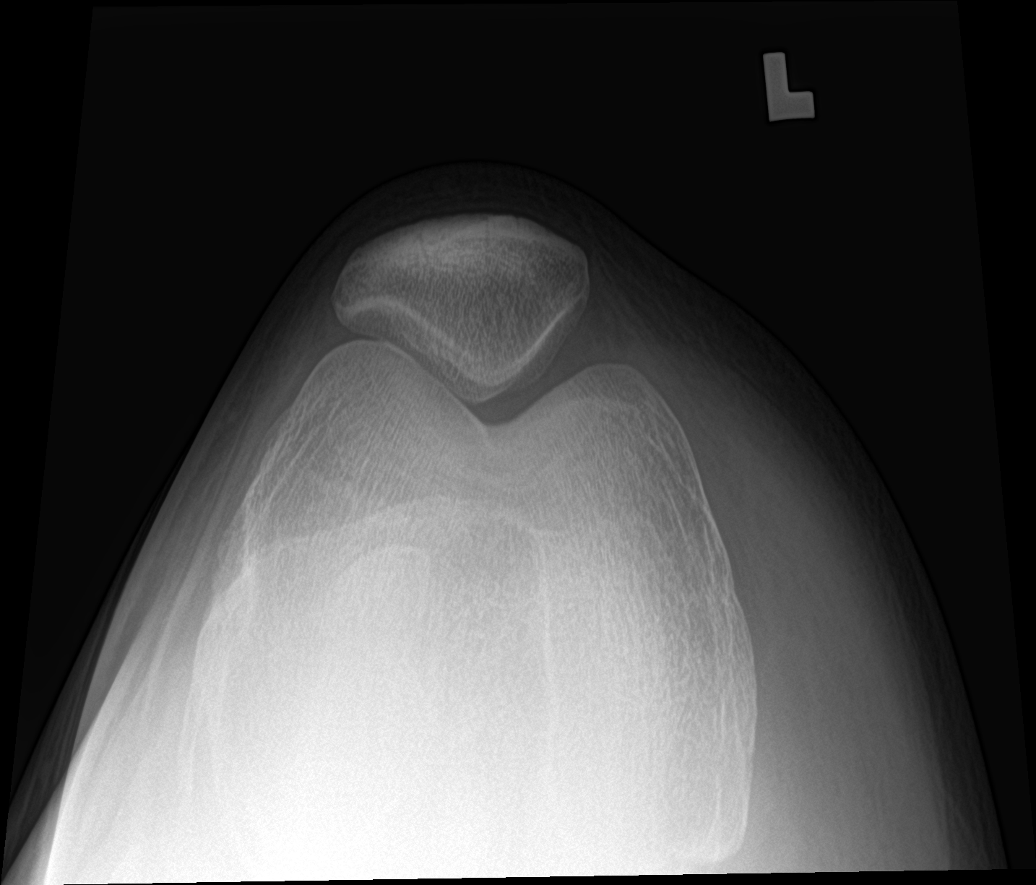

[knee ap bilat standing]
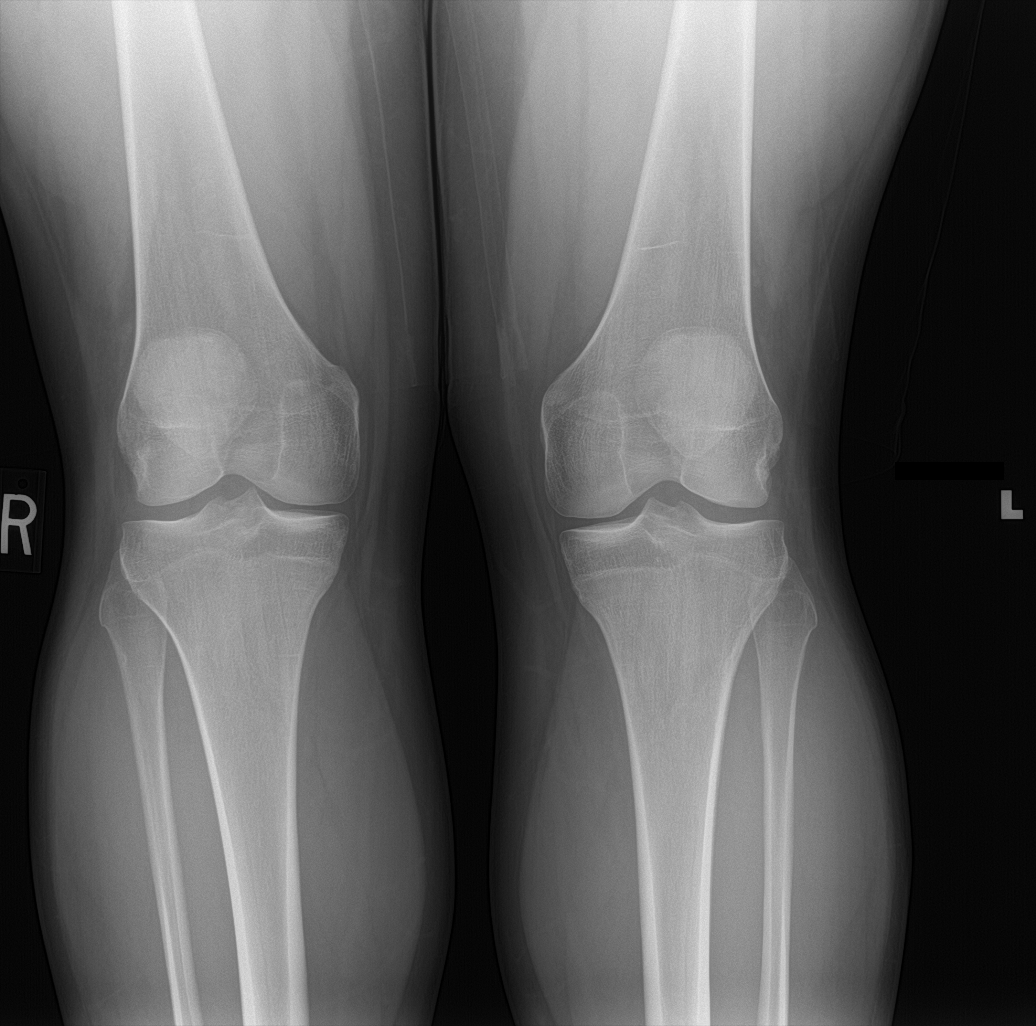

[4 of 4 positions shown; findings below may reference images not displayed]

FINDINGS: No fracture or dislocation. No joint effusion or evidence of
lipohemarthrosis. Joint spaces are preserved. No evidence of
chondrocalcinosis. No evidence of patella baja or [place] deformity.
Regional soft tissues appear normal.
IMPRESSION: No explanation for patient's left knee and patellar pain.
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
## 2022-02-26 DIAGNOSIS — F429 Obsessive-compulsive disorder, unspecified: Secondary | ICD-10-CM | POA: Diagnosis not present

## 2022-02-26 DIAGNOSIS — F411 Generalized anxiety disorder: Secondary | ICD-10-CM | POA: Diagnosis not present

## 2022-02-26 DIAGNOSIS — F959 Tic disorder, unspecified: Secondary | ICD-10-CM | POA: Diagnosis not present

## 2022-03-26 DIAGNOSIS — F959 Tic disorder, unspecified: Secondary | ICD-10-CM | POA: Diagnosis not present

## 2022-03-26 DIAGNOSIS — F429 Obsessive-compulsive disorder, unspecified: Secondary | ICD-10-CM | POA: Diagnosis not present

## 2022-03-26 DIAGNOSIS — F411 Generalized anxiety disorder: Secondary | ICD-10-CM | POA: Diagnosis not present

## 2022-05-20 DIAGNOSIS — F429 Obsessive-compulsive disorder, unspecified: Secondary | ICD-10-CM | POA: Diagnosis not present

## 2022-05-20 DIAGNOSIS — F959 Tic disorder, unspecified: Secondary | ICD-10-CM | POA: Diagnosis not present

## 2022-05-20 DIAGNOSIS — F411 Generalized anxiety disorder: Secondary | ICD-10-CM | POA: Diagnosis not present

## 2022-08-01 DIAGNOSIS — J40 Bronchitis, not specified as acute or chronic: Secondary | ICD-10-CM | POA: Diagnosis not present

## 2022-08-16 DIAGNOSIS — F429 Obsessive-compulsive disorder, unspecified: Secondary | ICD-10-CM | POA: Diagnosis not present

## 2022-08-16 DIAGNOSIS — F411 Generalized anxiety disorder: Secondary | ICD-10-CM | POA: Diagnosis not present

## 2022-08-16 DIAGNOSIS — Z79899 Other long term (current) drug therapy: Secondary | ICD-10-CM | POA: Diagnosis not present

## 2022-08-16 DIAGNOSIS — F959 Tic disorder, unspecified: Secondary | ICD-10-CM | POA: Diagnosis not present

## 2022-09-24 DIAGNOSIS — Z7189 Other specified counseling: Secondary | ICD-10-CM | POA: Diagnosis not present

## 2022-09-24 DIAGNOSIS — Z0001 Encounter for general adult medical examination with abnormal findings: Secondary | ICD-10-CM | POA: Diagnosis not present

## 2022-09-24 DIAGNOSIS — Z6841 Body Mass Index (BMI) 40.0 and over, adult: Secondary | ICD-10-CM | POA: Diagnosis not present

## 2022-09-24 DIAGNOSIS — Z713 Dietary counseling and surveillance: Secondary | ICD-10-CM | POA: Diagnosis not present

## 2022-09-24 DIAGNOSIS — Z23 Encounter for immunization: Secondary | ICD-10-CM | POA: Diagnosis not present

## 2023-04-24 DIAGNOSIS — F410 Panic disorder [episodic paroxysmal anxiety] without agoraphobia: Secondary | ICD-10-CM | POA: Insufficient documentation

## 2023-04-29 ENCOUNTER — Ambulatory Visit: Payer: BC Managed Care – PPO | Admitting: Sports Medicine

## 2024-04-26 ENCOUNTER — Institutional Professional Consult (permissible substitution): Admitting: Nurse Practitioner

## 2024-04-28 ENCOUNTER — Institutional Professional Consult (permissible substitution): Admitting: Nurse Practitioner

## 2024-05-13 DIAGNOSIS — Z0289 Encounter for other administrative examinations: Secondary | ICD-10-CM

## 2024-05-17 ENCOUNTER — Ambulatory Visit: Admitting: Nurse Practitioner

## 2024-05-17 ENCOUNTER — Encounter: Payer: Self-pay | Admitting: Nurse Practitioner

## 2024-05-17 VITALS — BP 123/79 | HR 77 | Temp 98.5°F | Ht 71.0 in | Wt 301.0 lb

## 2024-05-17 DIAGNOSIS — Z6841 Body Mass Index (BMI) 40.0 and over, adult: Secondary | ICD-10-CM | POA: Diagnosis not present

## 2024-05-17 DIAGNOSIS — E66813 Obesity, class 3: Secondary | ICD-10-CM

## 2024-05-17 DIAGNOSIS — E65 Localized adiposity: Secondary | ICD-10-CM

## 2024-05-17 NOTE — Progress Notes (Signed)
 Office: [phone]  /  Fax: [phone]   Initial Visit  Aloys Hupfer was seen in clinic today to evaluate for obesity. He is interested in losing weight to improve overall health and reduce the risk of weight related complications. He presents today to review program treatment options, initial physical assessment, and evaluation.     He was referred by: Friend or Family  When asked what else they would like to accomplish? He states: Adopt a healthier eating pattern and lifestyle, Improve energy levels and physical activity, Improve existing medical conditions, and Improve quality of life   When asked how has your weight affected you? He states: Having fatigue and Having poor endurance  Some associated conditions: GERD, ADHD, GAD, OCD, panic disorders  Contributing factors: family history of obesity, consumption of processed foods, use of obesogenic medications: Psychotropic medications, moderate to high levels of stress, chronic skipping of meals, sedentary job, and hectic pace of life  Weight promoting medications identified: Psychotropic medications  Current nutrition plan: None  Current level of physical activity: None  Current or previous pharmacotherapy: None  Response to medication: Never tried medications   Past medical history includes:   Past Medical History:  Diagnosis Date   ADHD (attention deficit hyperactivity disorder)    Anxiety    OCD (obsessive compulsive disorder)      Objective:   BP 123/79   Pulse 77   Temp 98.5 F (36.9 C)   Ht 5' 11 (1.803 m)   Wt (!) 301 lb (136.5 kg)   SpO2 97%   BMI 41.98 kg/m  He was weighed on the bioimpedance scale: Body mass index is 41.98 kg/m.  Peak Weight:301 lbs , Body Fat%:34.2%, Visceral Fat Rating:18, Weight trend over the last 12 months: Increasing  General:  Alert, oriented and cooperative. Patient is in no acute distress.  Respiratory: Normal respiratory effort, no problems with respiration noted    Gait: able to ambulate independently  Mental Status: Normal mood and affect. Normal behavior. Normal judgment and thought content.   DIAGNOSTIC DATA REVIEWED:  BMET No results found for: NA, K, CL, CO2, GLUCOSE, BUN, CREATININE, CALCIUM, GFRNONAA, GFRAA No results found for: HGBA1C No results found for: INSULIN CBC No results found for: WBC, RBC, HGB, HCT, PLT, MCV, MCH, MCHC, RDW Iron/TIBC/Ferritin/ %Sat No results found for: IRON, TIBC, FERRITIN, IRONPCTSAT Lipid Panel  No results found for: CHOL, TRIG, HDL, CHOLHDL, VLDL, LDLCALC, LDLDIRECT Hepatic Function Panel  No results found for: PROT, ALBUMIN, AST, ALT, ALKPHOS, BILITOT, BILIDIR, IBILI No results found for: TSH   Assessment and Plan:   Visceral obesity Will continue to monitor   Obesity, Class III, BMI 40-49.9 (morbid obesity)        Obesity Treatment / Action Plan:  Patient will work on garnering support from family and friends to begin weight loss journey. Will work on eliminating or reducing the presence of highly palatable, calorie dense foods in the home. Will complete provided nutritional and psychosocial assessment questionnaire before the next appointment. Will be scheduled for indirect calorimetry to determine resting energy expenditure in a fasting state.  This will allow us  to create a reduced calorie, high-protein meal plan to promote loss of fat mass while preserving muscle mass. Counseled on the health benefits of losing 5%-15% of total body weight. Was counseled on nutritional approaches to weight loss and benefits of reducing processed foods and consuming plant-based foods and high quality protein as part of nutritional weight management. Was counseled on pharmacotherapy and role  as an adjunct in weight management.   Obesity Education Performed Today:  He was weighed on the bioimpedance scale and results were  discussed and documented in the synopsis.  We discussed obesity as a disease and the importance of a more detailed evaluation of all the factors contributing to the disease.  We discussed the importance of long term lifestyle changes which include nutrition, exercise and behavioral modifications as well as the importance of customizing this to his specific health and social needs.  We discussed the benefits of reaching a healthier weight to alleviate the symptoms of existing conditions and reduce the risks of the biomechanical, metabolic and psychological effects of obesity.  Jaxston Gayler appears to be in the action stage of change and states they are ready to start intensive lifestyle modifications and behavioral modifications.  30 minutes was spent today on this visit including the above counseling, pre-visit chart review, and post-visit documentation.  Reviewed by clinician on day of visit: allergies, medications, problem list, medical history, surgical history, family history, social history, and previous encounter notes pertinent to obesity diagnosis.    Corean SAUNDERS Cortlan Dolin FNP-C

## 2024-06-10 ENCOUNTER — Ambulatory Visit: Admitting: Bariatrics

## 2024-06-10 ENCOUNTER — Encounter: Payer: Self-pay | Admitting: Bariatrics

## 2024-06-10 VITALS — BP 143/92 | HR 78 | Temp 97.7°F | Ht 71.0 in | Wt 302.0 lb

## 2024-06-10 DIAGNOSIS — Z1331 Encounter for screening for depression: Secondary | ICD-10-CM

## 2024-06-10 DIAGNOSIS — E559 Vitamin D deficiency, unspecified: Secondary | ICD-10-CM | POA: Diagnosis not present

## 2024-06-10 DIAGNOSIS — R030 Elevated blood-pressure reading, without diagnosis of hypertension: Secondary | ICD-10-CM

## 2024-06-10 DIAGNOSIS — Z0000 Encounter for general adult medical examination without abnormal findings: Secondary | ICD-10-CM

## 2024-06-10 DIAGNOSIS — R5383 Other fatigue: Secondary | ICD-10-CM

## 2024-06-10 DIAGNOSIS — E66813 Obesity, class 3: Secondary | ICD-10-CM

## 2024-06-10 DIAGNOSIS — R0602 Shortness of breath: Secondary | ICD-10-CM | POA: Diagnosis not present

## 2024-06-10 DIAGNOSIS — I10 Essential (primary) hypertension: Secondary | ICD-10-CM

## 2024-06-10 DIAGNOSIS — Z6841 Body Mass Index (BMI) 40.0 and over, adult: Secondary | ICD-10-CM

## 2024-06-10 DIAGNOSIS — E65 Localized adiposity: Secondary | ICD-10-CM

## 2024-06-10 NOTE — Progress Notes (Signed)
 At a Glance:  Vitals Temp: 97.7 F (36.5 C) BP: (!) 143/92 Pulse Rate: 78 SpO2: 97 %   Anthropometric Measurements Height: 5' 11 (1.803 m) Weight: (!) 302 lb (137 kg) BMI (Calculated): 42.14 Starting Weight: 302lb Peak Weight: 301lb   Body Composition  Body Fat %: 34.3 % Fat Mass (lbs): 103.6 lbs Muscle Mass (lbs): 188.8 lbs Total Body Water (lbs): 136.2 lbs Visceral Fat Rating : 17   Other Clinical Data Fasting: yes Labs: yes Today's Visit #: 1 Starting Date: 06/10/24     Indirect Calorimeter:   Resting Metabolic Rate ( RMR):  RMR (actual): 2707 kcal RMR (calculated): 2834 kcal The calculated basal metabolic rate is 7165 kcal thus his basal metabolic rate is slightly worse than expected.  Plan:   Indirect calorimeter completed, interpreted and reviewed with patient today and allowed to ask questions.  Discussed the implications for the chosen plan and exercise based on the RMR reading.  Will consider repeating the RMR in the future based on weight loss.    Chief Complaint:  Obesity   Subjective:  Mark Navarro (MR# [id number]) is a 23 y.o. male who presents for evaluation and treatment of obesity and related comorbidities.   Mark Navarro is currently in the action stage of change and ready to dedicate time achieving and maintaining a healthier weight. Mark Navarro is interested in becoming our patient and working on intensive lifestyle modifications including (but not limited to) diet and exercise for weight loss.  [name] has been struggling with his weight. He has been unsuccessful in either losing weight, maintaining weight loss, or reaching his healthy weight goal.  Mark Navarro's habits were reviewed today and are as follows: His family eats meals together, he thinks his family will eat healthier with him, he started gaining weight in middle school, he is a picky eater and doesn't like to eat healthier foods, he has significant food cravings issues, he snacks  frequently in the evenings, he skips meals frequently, he frequently makes poor food choices, and he has problems with excessive hunger.  Current or previous pharmacotherapy: None  Response to medication: Never tried medications  Other Fatigue Mark Navarro admits to daytime somnolence and admits to waking up still tired. Mark Navarro generally gets 6 hours of sleep per night, and states that he has diagnosed with sleep apnea and uses a mouth piece. . Snoring is present. Apneic episodes are present. Epworth Sleepiness Score is 8.   Shortness of Breath Mark Navarro notes increasing shortness of breath with exercising and seems to be worsening over time with weight gain. He notes getting out of breath sooner with activity than he used to. This has gotten worse recently. Mark Navarro denies shortness of breath at rest or orthopnea.  Depression Screen Mark Navarro's Food and Mood (modified PHQ-9) score was 20. 20-27 severe depression      No data to display           Assessment and Plan:   Other Fatigue Mark Navarro does feel that his weight is causing his energy to be lower than it should be. Fatigue may be related to obesity, depression or many other causes. Labs will be ordered, and in the meanwhile, Mark Navarro will focus on self care including making healthy food choices, increasing physical activity and focusing on stress reduction.  Shortness of Breath Mark Navarro does feel that he gets out of breath more easily that he used to when he exercises. Mark Navarro's shortness of breath appears to be obesity related and exercise induced. He has agreed to  work on weight loss and gradually increase exercise to treat his exercise induced shortness of breath. Will continue to monitor closely.  Health Maintenance:   Obesity   Plan: Will do indirect calorimetry, and labs.  He had an EKG done on 01/06/24.    Vitamin D Deficiency He is at risk for vitamin D deficiency due to obesity.  He is not on any supplements.   Plan: Will check for vitamin  D deficiency.   Elevated blood pressure without diagnosis of hypertension:   No diagnosis   Plan: Will begin the plan and exercise.  Will follow his blood pressure over time.   Visceral Obesity.   He has a visceral fat rating of 17 per the bio-impedence scale. His rating was 18 at his last visit.   Plan: The goal is a visceral fat rating of 13 or below.  Will work on the plan and begin exercise.   Will minimize all carbohydrates (sweets and starches) .     Mark Navarro had a positive depression screening. Depression is commonly associated with obesity and often results in emotional eating behaviors. We will monitor this closely and work on CBT to help improve the non-hunger eating patterns. Referral to Psychology may be required if no improvement is seen as he continues in our clinic.    Previous labs reviewed today: no recent labs   Labs done today CMP, Lipids, Insulin, HgbA1c, Vit D, Vit B12, and CBC, and TSH   Morbid Obesity: BMI (Calculated): 42.14   Mark Navarro is currently in the action stage of change and his goal is to begin weight loss efforts. I recommend Mark Navarro begin the structured treatment plan as follows:  He has agreed to Category 4 Plan and keeping a food journal and adhering to recommended goals of 2,000 calories and 100 to 140 protein.  We also discussed calorie counting and he can stick to a goal of 2000 cal with approximately 140 g of protein daily.  Exercise goals: All adults should avoid inactivity. Some activity is better than none, and adults who participate in any amount of physical activity, gain some health benefits. He will start to walk.   Behavioral modification strategies:increasing lean protein intake, decreasing simple carbohydrates, increasing vegetables, increase H2O intake, no skipping meals, meal planning and cooking strategies, keeping healthy foods in the home, better snacking choices, avoiding temptations, and planning for success  He was informed  of the importance of frequent follow-up visits to maximize his success with intensive lifestyle modifications for his multiple health conditions. He was informed we would discuss his lab results at his next visit unless there is a critical issue that needs to be addressed sooner. Barnes agreed to keep his next visit at the agreed upon time to discuss these results.  Objective:  General: Cooperative, alert, well developed, in no acute distress. HEENT: Conjunctivae and lids unremarkable. Cardiovascular: Regular rhythm.  Lungs: Normal work of breathing. Neurologic: No focal deficits.   No results found for: CREATININE, BUN, NA, K, CL, CO2 No results found for: ALT, AST, GGT, ALKPHOS, BILITOT No results found for: HGBA1C No results found for: INSULIN No results found for: TSH No results found for: CHOL, HDL, LDLCALC, LDLDIRECT, TRIG, CHOLHDL No results found for: WBC, HGB, HCT, MCV, PLT No results found for: IRON, TIBC, FERRITIN  Attestation Statements:  Applicable history such as the following:  allergies, medications, problem list, medical history, surgical history, family history, social history, and previous encounter notes reviewed by clinician on day of visit:  Time spent on visit in care of the patient today including the items listed below was 42 minutes.    20 minutes were spent talking about the history, 20 minutes for face to face counseling implementing the plan, discussing the specifics of how to arrange meals, meal planning, water intake.   I spent face to face time discussing his/her plan, including breakfast, additional breakfast options, lunch, and dinner options, grocery list, and snacks.  I reviewed his indirect calorimetry. I discussed the implications for the diet plan.    Discussed the bio-impedence test (fat %, muscle mass, and water weight) and allowed the patient to ask questions.   Discussed the following  information sheets: Category 4, Grocery List, 100 Calorie Snacks, 200 Calorie Snacks, and Microwave Meals.  I discussed with him that he should pack his lunch and we talked about how he can do this most effectively.  I additionally spent time documenting, reviewing, and checking the codes before submitting.   This may have been prepared with the assistance of Engineer, civil (consulting).  Occasional wrong-word or sound-a-like substitutions may have occurred due to the inherent limitations of voice recognition software.   Clayborne Daring, DO

## 2024-06-11 LAB — CBC WITH DIFFERENTIAL/PLATELET
Basophils Absolute: 0.1 x10E3/uL (ref 0.0–0.2)
Basos: 1 %
EOS (ABSOLUTE): 0.2 x10E3/uL (ref 0.0–0.4)
Eos: 3 %
Hematocrit: 45.1 % (ref 37.5–51.0)
Hemoglobin: 15 g/dL (ref 13.0–17.7)
Immature Grans (Abs): 0 x10E3/uL (ref 0.0–0.1)
Immature Granulocytes: 0 %
Lymphocytes Absolute: 3 x10E3/uL (ref 0.7–3.1)
Lymphs: 36 %
MCH: 29.8 pg (ref 26.6–33.0)
MCHC: 33.3 g/dL (ref 31.5–35.7)
MCV: 90 fL (ref 79–97)
Monocytes Absolute: 0.6 x10E3/uL (ref 0.1–0.9)
Monocytes: 7 %
Neutrophils Absolute: 4.6 x10E3/uL (ref 1.4–7.0)
Neutrophils: 53 %
Platelets: 332 x10E3/uL (ref 150–450)
RBC: 5.04 x10E6/uL (ref 4.14–5.80)
RDW: 12.9 % (ref 11.6–15.4)
WBC: 8.5 x10E3/uL (ref 3.4–10.8)

## 2024-06-11 LAB — COMPREHENSIVE METABOLIC PANEL WITH GFR
ALT: 54 IU/L — ABNORMAL HIGH (ref 0–44)
AST: 35 IU/L (ref 0–40)
Albumin: 4.7 g/dL (ref 4.3–5.2)
Alkaline Phosphatase: 90 IU/L (ref 44–121)
BUN/Creatinine Ratio: 18 (ref 9–20)
BUN: 15 mg/dL (ref 6–20)
Bilirubin Total: 0.4 mg/dL (ref 0.0–1.2)
CO2: 21 mmol/L (ref 20–29)
Calcium: 9.9 mg/dL (ref 8.7–10.2)
Chloride: 101 mmol/L (ref 96–106)
Creatinine, Ser: 0.85 mg/dL (ref 0.76–1.27)
Globulin, Total: 2.6 g/dL (ref 1.5–4.5)
Glucose: 76 mg/dL (ref 70–99)
Potassium: 4.4 mmol/L (ref 3.5–5.2)
Sodium: 139 mmol/L (ref 134–144)
Total Protein: 7.3 g/dL (ref 6.0–8.5)
eGFR: 126 mL/min/1.73 (ref >59)

## 2024-06-11 LAB — VITAMIN D 25 HYDROXY (VIT D DEFICIENCY, FRACTURES): Vit D, 25-Hydroxy: 16.6 ng/mL — ABNORMAL LOW (ref 30.0–100.0)

## 2024-06-11 LAB — INSULIN, RANDOM: INSULIN: 41 u[IU]/mL — ABNORMAL HIGH (ref 2.6–24.9)

## 2024-06-11 LAB — HEMOGLOBIN A1C
Est. average glucose Bld gHb Est-mCnc: 105 mg/dL
Hgb A1c MFr Bld: 5.3 % (ref 4.8–5.6)

## 2024-06-11 LAB — LIPID PANEL WITH LDL/HDL RATIO
Cholesterol, Total: 216 mg/dL — ABNORMAL HIGH (ref 100–199)
HDL: 37 mg/dL — ABNORMAL LOW (ref >39)
LDL Chol Calc (NIH): 138 mg/dL — ABNORMAL HIGH (ref 0–99)
LDL/HDL Ratio: 3.7 ratio — ABNORMAL HIGH (ref 0.0–3.6)
Triglycerides: 226 mg/dL — ABNORMAL HIGH (ref 0–149)
VLDL Cholesterol Cal: 41 mg/dL — ABNORMAL HIGH (ref 5–40)

## 2024-06-11 LAB — TSH: TSH: 1.6 u[IU]/mL (ref 0.450–4.500)

## 2024-06-11 LAB — VITAMIN B12: Vitamin B-12: 306 pg/mL (ref 232–1245)

## 2024-06-17 ENCOUNTER — Encounter: Payer: Self-pay | Admitting: Bariatrics

## 2024-06-17 DIAGNOSIS — E88819 Insulin resistance, unspecified: Secondary | ICD-10-CM | POA: Insufficient documentation

## 2024-06-17 DIAGNOSIS — R7401 Elevation of levels of liver transaminase levels: Secondary | ICD-10-CM | POA: Insufficient documentation

## 2024-06-17 DIAGNOSIS — E785 Hyperlipidemia, unspecified: Secondary | ICD-10-CM | POA: Insufficient documentation

## 2024-06-17 DIAGNOSIS — E559 Vitamin D deficiency, unspecified: Secondary | ICD-10-CM | POA: Insufficient documentation

## 2024-06-24 ENCOUNTER — Ambulatory Visit: Admitting: Bariatrics

## 2024-06-29 ENCOUNTER — Encounter: Payer: Self-pay | Admitting: Sports Medicine

## 2024-07-05 ENCOUNTER — Encounter: Payer: Self-pay | Admitting: Family Medicine

## 2024-07-05 ENCOUNTER — Ambulatory Visit: Admitting: Family Medicine

## 2024-07-05 VITALS — BP 128/85 | HR 74 | Temp 97.5°F | Ht 71.0 in | Wt 291.0 lb

## 2024-07-05 DIAGNOSIS — E559 Vitamin D deficiency, unspecified: Secondary | ICD-10-CM

## 2024-07-05 DIAGNOSIS — E88819 Insulin resistance, unspecified: Secondary | ICD-10-CM | POA: Diagnosis not present

## 2024-07-05 DIAGNOSIS — E782 Mixed hyperlipidemia: Secondary | ICD-10-CM | POA: Diagnosis not present

## 2024-07-05 DIAGNOSIS — R7401 Elevation of levels of liver transaminase levels: Secondary | ICD-10-CM

## 2024-07-05 DIAGNOSIS — Z6841 Body Mass Index (BMI) 40.0 and over, adult: Secondary | ICD-10-CM

## 2024-07-05 DIAGNOSIS — E66813 Obesity, class 3: Secondary | ICD-10-CM

## 2024-07-05 DIAGNOSIS — E538 Deficiency of other specified B group vitamins: Secondary | ICD-10-CM

## 2024-07-05 MED ORDER — VITAMIN D (ERGOCALCIFEROL) 1.25 MG (50000 UNIT) PO CAPS: 50000.0000 [IU] | ORAL_CAPSULE | ORAL | 0 refills | Status: DC

## 2024-07-05 NOTE — Progress Notes (Signed)
 Office: [phone]  /  Fax: [phone]  WEIGHT SUMMARY AND BIOMETRICS  Starting Date: 06/10/24  Starting Weight: 302lb   Weight Lost Since Last Visit: 11lb   Vitals Temp: (!) 97.5 F (36.4 C) BP: 128/85 Pulse Rate: 74 SpO2: 97 %   Body Composition  Body Fat %: 32.6 % Fat Mass (lbs): 94.8 lbs Muscle Mass (lbs): 186.6 lbs Total Body Water (lbs): 132.2 lbs Visceral Fat Rating : 15    HPI  Chief Complaint: OBESITY  Mark Navarro is here to discuss his progress with his obesity treatment plan. He is on the the Category 4 Plan and states he is following his eating plan approximately 75 % of the time. He states he is exercising 0 minutes 0 times per week.  Interval History:  Since last office visit he is down 11 lb Wife and mom are supportive He has been on vacation  since last visit He is eating on a schedule He is getting in more lean protein and fiber He has times that he is more hungry but has added in better snacks He has had more loose stools lately He is tracking calories on the MyFitnessPal ap He has not added in exercise  Pharmacotherapy: none  PHYSICAL EXAM:  Blood pressure 128/85, pulse 74, temperature (!) 97.5 F (36.4 C), height 5' 11 (1.803 m), weight 291 lb (132 kg), SpO2 97%. Body mass index is 40.59 kg/m.  General: He is overweight, cooperative, alert, well developed, and in no acute distress. PSYCH: Has normal mood, affect and thought process.   Lungs: Normal breathing effort, no conversational dyspnea.   ASSESSMENT AND PLAN  TREATMENT PLAN FOR OBESITY:  Recommended Dietary Goals  Artrell is currently in the action stage of change. As such, his goal is to continue weight management plan. He has agreed to the Category 4 Plan.  Behavioral Intervention  We discussed the following Behavioral Modification Strategies today: increasing lean protein intake to established goals, increasing fiber rich foods, increasing water intake , work on meal  planning and preparation, keeping healthy foods at home, identifying sources and decreasing liquid calories, avoiding temptations and identifying enticing environmental cues, planning for success, and avoid all or none thinking.  Additional resources provided today: NA  Recommended Physical Activity Goals  Dayvin has been advised to work up to 150 minutes of moderate intensity aerobic activity a week and strengthening exercises 2-3 times per week for cardiovascular health, weight loss maintenance and preservation of muscle mass.   He has agreed to Increase physical activity in their day and reduce sedentary time (increase NEAT). and Start strengthening exercises with a goal of 2-3 sessions a week  Reviewed exercise change goals on AVS  Pharmacotherapy changes for the treatment of obesity: none  ASSOCIATED CONDITIONS ADDRESSED TODAY  Vitamin D  deficiency New Reviewed lab results with patient Last vitamin D  Lab Results  Component Value Date   VD25OH 16.6 (L) 06/10/2024  He is not taking any supplements.  He c/o fatigue Begin: -     Vitamin D  (Ergocalciferol ); Take 1 capsule (50,000 Units total) by mouth every 7 (seven) days.  Dispense: 12 capsule; Refill: 0 Repeat labs in 3-4 mos  Class 3 severe obesity due to excess calories with serious comorbidity and body mass index (BMI) of 40.0 to 44.9 in adult Improving with caloric restriction/ reducing starches and sugar/ dietary logging Plan: Track daily steps Download weight training app to add in gym workouts for resistance training 3 days/ wk  Insulin  resistance New  Reviewed lab results with patient.  Fasting insulin  high at 41 Has signs of IR including hyperphagia and truncal adiposity A1c and fasting glucose normal Continue to work on a low sugar/ lower carb diet focused on lean protein and fiber with meals Add in more walking and weight training for IR treatment  Elevated ALT measurement Lab Results  Component Value Date   ALT  54 (H) 06/10/2024  New. Reviewed labs with patient No previous hx of NAFLD and seldom drinks ETOH Repeat liver enzymes in 3-4 mos and if still high, will get liver ultrasound Continue active plan for weight loss  Mixed hyperlipidemia Lab Results  Component Value Date   CHOL 216 (H) 06/10/2024   HDL 37 (L) 06/10/2024   LDLCALC 138 (H) 06/10/2024   TRIG 226 (H) 06/10/2024   New Reviewed lab findings with patient Not on lipid lowering meds Has greatly reduced intake of saturated fat and sugar Recommend adding daily fiber supplement like sugar free metamucil daily Repeat labs in 3-4 mos  Low serum vitamin B12 Lab Results  Component Value Date   VITAMINB12 306 06/10/2024  New. B12 level is low normal He does eat beef and eggs Recommend adding in a Men's MVI daily   He was informed of the importance of frequent follow up visits to maximize his success with intensive lifestyle modifications for his multiple health conditions.   ATTESTASTION STATEMENTS:  Reviewed by clinician on day of visit: allergies, medications, problem list, medical history, surgical history, family history, social history, and previous encounter notes pertinent to obesity diagnosis.   I have personally spent 40 minutes total time today in preparation, patient care, nutritional counseling and education,  and documentation for this visit, including the following: review of most recent clinical lab tests, prescribing RX and OTC supplements,  reviewing medical assistant documentation, review and interpretation of bioimpedence results.     Darice Haddock, D.O. DABFM, DABOM [facility] [address] [place], [place] [address] [phone]

## 2024-07-05 NOTE — Patient Instructions (Addendum)
 Start Vitamin D  50,000 international units  once weekly  Start a Men's Multivitamin daily  Continue tracking calorie using the MyFitnessPal app Aim for 2000 cal/ day This should include 100-130 g of protein daily  Check out Joey Suggs on Youtube for easy meal planning ideas Stay off sugary drinks  Track daily steps with a smart watch Aim for 10,000 steps/ day Aim training at the gym 3 days/ wk -- check out resistance training apps   Add Metamucil sugar free + a full glass of water daily

## 2024-07-27 ENCOUNTER — Ambulatory Visit: Admitting: Family Medicine

## 2024-08-02 ENCOUNTER — Ambulatory Visit: Admitting: Nurse Practitioner

## 2024-08-19 ENCOUNTER — Encounter: Payer: Self-pay | Admitting: Bariatrics

## 2024-08-19 ENCOUNTER — Ambulatory Visit: Admitting: Bariatrics

## 2024-08-19 VITALS — BP 121/80 | HR 75 | Ht 71.0 in | Wt 275.0 lb

## 2024-08-19 DIAGNOSIS — E669 Obesity, unspecified: Secondary | ICD-10-CM

## 2024-08-19 DIAGNOSIS — E6609 Other obesity due to excess calories: Secondary | ICD-10-CM

## 2024-08-19 DIAGNOSIS — E65 Localized adiposity: Secondary | ICD-10-CM

## 2024-08-19 DIAGNOSIS — Z6838 Body mass index (BMI) 38.0-38.9, adult: Secondary | ICD-10-CM | POA: Diagnosis not present

## 2024-08-19 NOTE — Progress Notes (Signed)
 WEIGHT SUMMARY AND BIOMETRICS  Weight Lost Since Last Visit: 16lb  Weight Gained Since Last Visit: 0   No data recorded Anthropometric Measurements Height: 5' 11 (1.803 m) Weight: 275 lb (124.7 kg) BMI (Calculated): 38.37 Weight at Last Visit: 291lb Weight Lost Since Last Visit: 16lb Weight Gained Since Last Visit: 0 Starting Weight: 302lb Total Weight Loss (lbs): 27 lb (12.2 kg) Peak Weight: 301lb   Body Composition  Body Fat %: 30.6 % Fat Mass (lbs): 84.4 lbs Muscle Mass (lbs): 181.8 lbs Total Body Water (lbs): 129.6 lbs Visceral Fat Rating : 13   Other Clinical Data Fasting: no Labs: no Today's Visit #: 3 Starting Date: 06/10/24    OBESITY Holland is here to discuss his progress with his obesity treatment plan along with follow-up of his obesity related diagnoses.    Nutrition Plan: the Category 4 plan - 90% adherence.  Current exercise: walking and weightlifting  Interim History:  He is down 16 lbs over 6 weeks. He is watching his portion size. He has cut out soda completely.  Eating all of the food on the plan., Protein intake is as prescribed, Is not skipping meals, and Water intake is adequate.   Hunger is moderately controlled.  Cravings are moderately controlled.  Assessment/Plan:    Visceral Obesity.   He has a visceral fat rating of 13 per the bio-impedence scale. His initial reading was 17.   Plan: The goal is a visceral fat rating of 13 or below.  Will continue to increase his exercise both cardio and resistance.  Will minimize all carbohydrates ( sweets and starches ).   He will keep his daily protein levels high.     Generalized Obesity: Current BMI BMI (Calculated): 38.37   Pharmacotherapy Plan No anti-obesity medications.   Corrie is currently in the action stage of change. As such, his goal is to continue with weight  loss efforts.  He has agreed to the Category 4 plan.  Exercise goals: All adults should avoid inactivity. Some physical activity is better than none, and adults who participate in any amount of physical activity gain some health benefits. He is walking and working out some with weights at the gym.   Behavioral modification strategies: increasing lean protein intake, no meal skipping, meal planning , increase water intake, better snacking choices, planning for success, increasing vegetables, weigh protein portions, and mindful eating.  Nishawn has agreed to follow-up with our clinic in 2 weeks with Dr. Waylan.    Objective:   VITALS: Per patient if applicable, see vitals. GENERAL: Alert and in no acute distress. CARDIOPULMONARY: No increased WOB. Speaking in clear sentences.  PSYCH: Pleasant and cooperative. Speech normal rate and rhythm. Affect is appropriate. Insight and judgement are appropriate. Attention is focused, linear, and appropriate.  NEURO: Oriented as arrived to appointment on time with no prompting.   Attestation  Statements:   This was prepared with the assistance of Engineer, Civil (consulting).  Occasional wrong-word or sound-a-like substitutions may have occurred due to the inherent limitations of voice recognition.  Clayborne Daring, DO

## 2024-09-16 ENCOUNTER — Ambulatory Visit: Admitting: Family Medicine

## 2024-10-12 ENCOUNTER — Encounter: Payer: Self-pay | Admitting: Family Medicine

## 2024-10-12 ENCOUNTER — Ambulatory Visit: Admitting: Family Medicine

## 2024-10-12 VITALS — BP 123/79 | HR 80 | Ht 71.0 in | Wt 275.0 lb

## 2024-10-12 DIAGNOSIS — R7401 Elevation of levels of liver transaminase levels: Secondary | ICD-10-CM

## 2024-10-12 DIAGNOSIS — E88819 Insulin resistance, unspecified: Secondary | ICD-10-CM

## 2024-10-12 DIAGNOSIS — E782 Mixed hyperlipidemia: Secondary | ICD-10-CM

## 2024-10-12 DIAGNOSIS — Z6838 Body mass index (BMI) 38.0-38.9, adult: Secondary | ICD-10-CM

## 2024-10-12 DIAGNOSIS — E559 Vitamin D deficiency, unspecified: Secondary | ICD-10-CM

## 2024-10-12 MED ORDER — VITAMIN D (ERGOCALCIFEROL) 1.25 MG (50000 UNIT) PO CAPS: 50000.0000 [IU] | ORAL_CAPSULE | ORAL | 0 refills | Status: DC

## 2024-10-12 NOTE — Progress Notes (Signed)
 Office: [phone]  /  Fax: [phone]  WEIGHT SUMMARY AND BIOMETRICS  Starting Date: 06/10/24  Starting Weight: 302lb   Weight Lost Since Last Visit: 0lb   Vitals BP: 123/79 Pulse Rate: 80 SpO2: 96 %   Body Composition  Body Fat %: 31 % Fat Mass (lbs): 85.4 lbs Muscle Mass (lbs): 181 lbs Total Body Water (lbs): 127.4 lbs Visceral Fat Rating : 13     HPI  Chief Complaint: OBESITY  Mark Navarro is here to discuss his progress with his obesity treatment plan. He is on the the Category 4 Plan and states he is following his eating plan approximately 45-50 % of the time. He states he is exercising 0 minutes 0 times per week.   Interval History:  Since last office visit he is down 0 lb This gives him a net weight loss of 27 lb in 4 mos of medically supervised weight management This is an 8.9% total body weight loss He has been mindful of portion sizes even when eating out He has had more sugar cravings with exposure to junk food at work His wife has been supportive He is still getting protein with all meals He is staying off of SSBs He has been less active due to a busy work schedule, time change and later changed  Pharmacotherapy: None, he has declined  PHYSICAL EXAM:  Blood pressure 123/79, pulse 80, height 5' 11 (1.803 m), weight 275 lb (124.7 kg), SpO2 96%. Body mass index is 38.35 kg/m.  General: He is overweight, cooperative, alert, well developed, and in no acute distress. PSYCH: Has normal mood, affect and thought process.   Lungs: Normal breathing effort, no conversational dyspnea.   ASSESSMENT AND PLAN  TREATMENT PLAN FOR OBESITY:  Recommended Dietary Goals  Mark Navarro is currently in the action stage of change. As such, his goal is to continue weight management plan. He has agreed to using AI to create a 2000-calorie healthy meal plan as instructed today. This will be a change from category 4 meal plan as he has grown bored of it  Behavioral  Intervention  We discussed the following Behavioral Modification Strategies today: increasing lean protein intake to established goals, increasing fiber rich foods, increasing water intake , work on meal planning and preparation, keeping healthy foods at home, work on managing stress, creating time for self-care and relaxation, continue to practice mindfulness when eating, planning for success, and avoid all-or-none thinking.  Additional resources provided today: NA  Recommended Physical Activity Goals  Mark Navarro has been advised to work up to 150 minutes of moderate intensity aerobic activity a week and strengthening exercises 2-3 times per week for cardiovascular health, weight loss maintenance and preservation of muscle mass.   He has agreed to [employer] strengthening exercises with a goal of 2-3 sessions a week  We discussed the importance of both cardio and resistance training, with indoor options during winter months reviewed  Pharmacotherapy changes for the treatment of obesity: None, given information on both Qsymia and Zepbound  ASSOCIATED CONDITIONS ADDRESSED TODAY  Insulin  resistance -     Insulin , random Last fasting insulin  high at 41 on 06/10/2024.  He has lost 8.9% of his total body weight in the past 4 months of medically supervised weight management.  He has some room for improvement with consistency with diet and exercise changes.  Will consider the addition of metformin if needed.  Vitamin D  deficiency -     Vitamin D  (Ergocalciferol ); Take 1 capsule (50,000 Units total)  by mouth every 7 (seven) days.  Dispense: 12 capsule; Refill: 0 -     VITAMIN D  25 Hydroxy (Vit-D Deficiency, Fractures) Last vitamin D  Lab Results  Component Value Date   VD25OH 16.6 (L) 06/10/2024  He is currently on vitamin D  50,000 IU once weekly.  Energy level is improving.  Repeat labs today  Elevated ALT measurement -     Comprehensive metabolic panel with GFR Lab Results  Component Value Date   ALT  54 (H) 06/10/2024  Last ALT elevated and he is at risk for fatty liver disease.  Will repeat labs today.  He is actively working on weight loss and denies heavy intake of alcohol.  Mixed hyperlipidemia -     Lipid panel Lab Results  Component Value Date   CHOL 216 (H) 06/10/2024   HDL 37 (L) 06/10/2024   LDLCALC 138 (H) 06/10/2024   TRIG 226 (H) 06/10/2024  He is not on any lipid-lowering medication.  He is actively working on a low saturated fat/low sugar diet.  He has room for more consistent exercise and is continuing to see weight loss.  Repeat fasting lipid panel today  Morbid obesity (HCC) Overall, improving with reduced calorie diet though has had a standstill over the past month.  Stress levels and history of depression have factored in.  We discussed winter exercise options to keep him more active and the AI created we will plan to give him more diverse food choices.  Consider the addition of antiobesity medication given his slowdown in weight loss.  Discussed Qsymia versus Zepbound today, reviewed websites, pros and cons.  He will consider this as an option for next visit.  BMI 38.0-38.9,adult      He was informed of the importance of frequent follow up visits to maximize his success with intensive lifestyle modifications for his multiple health conditions.   ATTESTASTION STATEMENTS:  Reviewed by clinician on day of visit: allergies, medications, problem list, medical history, surgical history, family history, social history, and previous encounter notes pertinent to obesity diagnosis.   I have personally spent 30 minutes total time today in preparation, patient care, nutritional counseling and education,  and documentation for this visit, including the following: review of most recent clinical lab tests, prescribing medications/ refilling medications, reviewing medical assistant documentation, review and interpretation of bioimpedence results.     Darice Haddock, D.O. DABFM,  DABOM [facility] [address] [place], [place] [address] [phone]

## 2024-10-12 NOTE — Patient Instructions (Signed)
 Labs today Will send results to MyChart tomorrow  Track daily calorie intake using the Lose It app aiming for 2000 cal/ day OR use Chat GPT app to create a new 2000 calorie healthy meal plan  Check out [email or web address] for more information  Stay as active as you can!

## 2024-10-13 LAB — COMPREHENSIVE METABOLIC PANEL WITH GFR
ALT: 26 IU/L (ref 0–44)
AST: 28 IU/L (ref 0–40)
Albumin: 4.6 g/dL (ref 4.3–5.2)
Alkaline Phosphatase: 97 IU/L (ref 47–123)
BUN/Creatinine Ratio: 13 (ref 9–20)
BUN: 10 mg/dL (ref 6–20)
Bilirubin Total: 0.3 mg/dL (ref 0.0–1.2)
CO2: 21 mmol/L (ref 20–29)
Calcium: 9.6 mg/dL (ref 8.7–10.2)
Chloride: 102 mmol/L (ref 96–106)
Creatinine, Ser: 0.8 mg/dL (ref 0.76–1.27)
Globulin, Total: 2.5 g/dL (ref 1.5–4.5)
Glucose: 87 mg/dL (ref 70–99)
Potassium: 4.5 mmol/L (ref 3.5–5.2)
Sodium: 143 mmol/L (ref 134–144)
Total Protein: 7.1 g/dL (ref 6.0–8.5)
eGFR: 128 mL/min/1.73 (ref >59)

## 2024-10-13 LAB — LIPID PANEL
Chol/HDL Ratio: 5.3 ratio — ABNORMAL HIGH (ref 0.0–5.0)
Cholesterol, Total: 187 mg/dL (ref 100–199)
HDL: 35 mg/dL — ABNORMAL LOW (ref >39)
LDL Chol Calc (NIH): 123 mg/dL — ABNORMAL HIGH (ref 0–99)
Triglycerides: 163 mg/dL — ABNORMAL HIGH (ref 0–149)
VLDL Cholesterol Cal: 29 mg/dL (ref 5–40)

## 2024-10-13 LAB — INSULIN, RANDOM: INSULIN: 33.7 u[IU]/mL — ABNORMAL HIGH (ref 2.6–24.9)

## 2024-10-13 LAB — VITAMIN D 25 HYDROXY (VIT D DEFICIENCY, FRACTURES): Vit D, 25-Hydroxy: 31 ng/mL (ref 30.0–100.0)

## 2024-10-25 ENCOUNTER — Ambulatory Visit: Payer: Self-pay | Admitting: Family Medicine

## 2024-11-15 ENCOUNTER — Ambulatory Visit: Admitting: Family Medicine

## 2024-11-15 ENCOUNTER — Encounter: Payer: Self-pay | Admitting: Family Medicine

## 2024-11-15 VITALS — BP 119/76 | HR 82 | Ht 71.0 in | Wt 277.0 lb

## 2024-11-15 DIAGNOSIS — Z6838 Body mass index (BMI) 38.0-38.9, adult: Secondary | ICD-10-CM

## 2024-11-15 DIAGNOSIS — E88819 Insulin resistance, unspecified: Secondary | ICD-10-CM

## 2024-11-15 DIAGNOSIS — R7401 Elevation of levels of liver transaminase levels: Secondary | ICD-10-CM | POA: Diagnosis not present

## 2024-11-15 DIAGNOSIS — E559 Vitamin D deficiency, unspecified: Secondary | ICD-10-CM | POA: Diagnosis not present

## 2024-11-15 DIAGNOSIS — E8881 Metabolic syndrome: Secondary | ICD-10-CM | POA: Insufficient documentation

## 2024-11-15 DIAGNOSIS — E782 Mixed hyperlipidemia: Secondary | ICD-10-CM | POA: Diagnosis not present

## 2024-11-15 MED ORDER — VITAMIN D (ERGOCALCIFEROL) 1.25 MG (50000 UNIT) PO CAPS: 50000.0000 [IU] | ORAL_CAPSULE | ORAL | 0 refills | Status: AC

## 2024-11-15 MED ORDER — VITAMIN D (ERGOCALCIFEROL) 1.25 MG (50000 UNIT) PO CAPS: 50000.0000 [IU] | ORAL_CAPSULE | ORAL | 0 refills | Status: DC

## 2024-11-15 NOTE — Progress Notes (Signed)
 "  Office: [phone]  /  Fax: [phone]  WEIGHT SUMMARY AND BIOMETRICS  Starting Date: 06/10/24  Starting Weight: 302lb   Weight Lost Since Last Visit: 0lb   Vitals BP: 119/76 Pulse Rate: 82 SpO2: 98 %   Body Composition  Body Fat %: 31 % Fat Mass (lbs): 86.2 lbs Muscle Mass (lbs): 182.2 lbs Total Body Water (lbs): 126.6 lbs Visceral Fat Rating : 13     HPI  Chief Complaint: OBESITY  Remi is here to discuss his progress with his obesity treatment plan. He is on the keeping a food journal and adhering to recommended goals of 2000 calories and 100 protein and states he is following his eating plan approximately 50 % of the time. He states he is exercising 0 minutes 0 times per week.   Interval History:  Since last office visit he is up 2 lb He is up 1.2 lb of muscle mass and up 0.8 lb of body fat since last visit This gives him a net weight loss of 25 lb in 5 mos of medically supervised weight management This is a 8.2% TBW loss He has previously declined starting anti obesity medication He has been trying to keep his daily kcal intake ~2000 per day but admits to getting a bit off track over the holidays He plans to start working out with a friend and has been doing some manual labor  Pharmacotherapy: none  PHYSICAL EXAM:  Blood pressure 119/76, pulse 82, height 5' 11 (1.803 m), weight 277 lb (125.6 kg), SpO2 98%. Body mass index is 38.63 kg/m.  General: He is overweight, cooperative, alert, well developed, and in no acute distress. PSYCH: Has normal mood, affect and thought process.   Lungs: Normal breathing effort, no conversational dyspnea.   ASSESSMENT AND PLAN  TREATMENT PLAN FOR OBESITY:  Recommended Dietary Goals  Bascom is currently in the action stage of change. As such, his goal is to continue weight management plan. He has agreed to keeping a food journal and adhering to recommended goals of 2000-2200 calories and 100-120 g of   protein.  Behavioral Intervention  We discussed the following Behavioral Modification Strategies today: increasing lean protein intake to established goals, increasing vegetables, increasing water intake , work on meal planning and preparation, work on counselling psychologist calories using tracking application, keeping healthy foods at home, work on managing stress, creating time for self-care and relaxation, continue to practice mindfulness when eating, planning for success, and getting back on track after recent lapse.  Additional resources provided today: NA  Recommended Physical Activity Goals  Jayron has been advised to work up to 150 minutes of moderate intensity aerobic activity a week and strengthening exercises 2-3 times per week for cardiovascular health, weight loss maintenance and preservation of muscle mass.   He has agreed to [employer] strengthening exercises with a goal of 2-3 sessions a week   Pharmacotherapy changes for the treatment of obesity: none (he declined)  ASSOCIATED CONDITIONS ADDRESSED TODAY  Metabolic syndrome Lab Results  Component Value Date   CHOL 187 10/12/2024   HDL 35 (L) 10/12/2024   LDLCALC 123 (H) 10/12/2024   TRIG 163 (H) 10/12/2024   CHOLHDL 5.3 (H) 10/12/2024   TG>150, HDL <40 and abdominal cirumf >40 Labs are improving, reviewed with patient today Continue to reduce saturated fat and sugar intake, ramp up exercise time (walking and weights) and consider adding metformin.  Vitamin D  deficiency Last vitamin D  Lab Results  Component Value Date  VD25OH 31.0 10/12/2024  Improving Reviewed labs with patient Energy level improving. Will keep RX vitamin D  once weekly with a goal > 50 -     Vitamin D  (Ergocalciferol ); Take 1 capsule (50,000 Units total) by mouth every 7 (seven) days.  Dispense: 12 capsule; Refill: 0  Morbid obesity (HCC)  BMI 38.0-38.9,adult  Insulin  resistance Improving Fasting insulin  improving from 41--> 33.7 See  metabolic syndrome above  Elevated ALT measurement Improved Reviewed labs with pt ALT has improved to normal  Mixed hyperlipidemia Improving Continue prescribed dietary changes, adding in more walking and weight training Repeat in 6 mos     He was informed of the importance of frequent follow up visits to maximize his success with intensive lifestyle modifications for his multiple health conditions.   ATTESTASTION STATEMENTS:  Reviewed by clinician on day of visit: allergies, medications, problem list, medical history, surgical history, family history, social history, and previous encounter notes pertinent to obesity diagnosis.   I have personally spent 30 minutes total time today in preparation, patient care, nutritional counseling and education,  and documentation for this visit, including the following: review of most recent clinical lab tests, prescribing medications/ refilling medications, reviewing medical assistant documentation, review and interpretation of bioimpedence results.     Darice Haddock, D.O. DABFM, DABOM [facility] [address] [place], [place] [address] [phone] "

## 2024-11-15 NOTE — Patient Instructions (Signed)
 Track daily calories using the MyFitnessPal app Aim for 2000-2200 calories per day This should include 100-120 g of protein per day  Avoid high sugar foods and drinks  Aim for a workout goal of 4 days/ wk  Continue Vitamin D  RX once a week  Water, sugar free liquids are fine  Remember your fruits and veggies

## 2024-12-21 ENCOUNTER — Ambulatory Visit: Admitting: Family Medicine
